# Patient Record
Sex: Female | Born: 1990 | Race: White | Hispanic: No | Marital: Single | Smoking: Never smoker
Health system: Southern US, Community
[De-identification: ages and names within clinical notes are randomized; demographics above are authoritative.]

## PROBLEM LIST (undated history)

## (undated) DIAGNOSIS — F419 Anxiety disorder, unspecified: Secondary | ICD-10-CM

## (undated) DIAGNOSIS — T7840XA Allergy, unspecified, initial encounter: Secondary | ICD-10-CM

## (undated) DIAGNOSIS — Z8709 Personal history of other diseases of the respiratory system: Secondary | ICD-10-CM

## (undated) DIAGNOSIS — F32A Depression, unspecified: Secondary | ICD-10-CM

## (undated) DIAGNOSIS — D649 Anemia, unspecified: Secondary | ICD-10-CM

## (undated) DIAGNOSIS — F329 Major depressive disorder, single episode, unspecified: Secondary | ICD-10-CM

## (undated) DIAGNOSIS — F410 Panic disorder [episodic paroxysmal anxiety] without agoraphobia: Secondary | ICD-10-CM

## (undated) HISTORY — PX: WISDOM TOOTH EXTRACTION: SHX21

## (undated) HISTORY — DX: Anxiety disorder, unspecified: F41.9

## (undated) HISTORY — DX: Depression, unspecified: F32.A

## (undated) HISTORY — DX: Allergy, unspecified, initial encounter: T78.40XA

## (undated) HISTORY — DX: Major depressive disorder, single episode, unspecified: F32.9

---
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
## 2015-10-16 ENCOUNTER — Other Ambulatory Visit (HOSPITAL_COMMUNITY): Payer: Self-pay | Admitting: General Surgery

## 2015-11-17 ENCOUNTER — Other Ambulatory Visit (HOSPITAL_COMMUNITY): Payer: Self-pay | Admitting: General Surgery

## 2015-11-24 ENCOUNTER — Ambulatory Visit: Payer: Self-pay | Admitting: Dietician

## 2015-11-27 ENCOUNTER — Encounter: Payer: Self-pay | Admitting: Family Medicine

## 2015-11-27 ENCOUNTER — Ambulatory Visit (HOSPITAL_COMMUNITY)
Admission: RE | Admit: 2015-11-27 | Discharge: 2015-11-27 | Disposition: A | Discharge status: Home / Self Care | Payer: BC Managed Care – PPO | Source: Ambulatory Visit | Attending: General Surgery | Admitting: General Surgery

## 2015-11-27 DIAGNOSIS — T7840XA Allergy, unspecified, initial encounter: Secondary | ICD-10-CM | POA: Insufficient documentation

## 2015-11-27 DIAGNOSIS — F329 Major depressive disorder, single episode, unspecified: Secondary | ICD-10-CM | POA: Insufficient documentation

## 2015-11-27 DIAGNOSIS — Z6841 Body Mass Index (BMI) 40.0 and over, adult: Secondary | ICD-10-CM | POA: Insufficient documentation

## 2015-11-27 DIAGNOSIS — F32A Depression, unspecified: Secondary | ICD-10-CM | POA: Insufficient documentation

## 2015-11-27 DIAGNOSIS — F419 Anxiety disorder, unspecified: Secondary | ICD-10-CM | POA: Insufficient documentation

## 2015-12-02 ENCOUNTER — Encounter: Payer: Self-pay | Admitting: Family Medicine

## 2015-12-02 ENCOUNTER — Ambulatory Visit (INDEPENDENT_AMBULATORY_CARE_PROVIDER_SITE_OTHER): Payer: BC Managed Care – PPO | Admitting: Family Medicine

## 2015-12-02 VITALS — BP 128/74 | HR 82 | Temp 98.4°F | Resp 14 | Ht 67.0 in | Wt 393.0 lb

## 2015-12-02 DIAGNOSIS — F329 Major depressive disorder, single episode, unspecified: Secondary | ICD-10-CM | POA: Diagnosis not present

## 2015-12-02 DIAGNOSIS — Z0000 Encounter for general adult medical examination without abnormal findings: Secondary | ICD-10-CM

## 2015-12-02 DIAGNOSIS — F32A Depression, unspecified: Secondary | ICD-10-CM

## 2015-12-02 NOTE — Assessment & Plan Note (Signed)
We'll continue to monitor her symptoms for now. She declines seeing a therapist at this time.

## 2015-12-02 NOTE — Progress Notes (Signed)
Patient ID: Alexandra Newton, female   DOB: 11-29-1990, 25 y.o.   MRN: [id number]   Subjective:    Patient ID: Alexandra Newton, female    DOB: 08-20-1990, 25 y.o.   MRN: [id number]  Patient presents for [facility] Patient here to establish care. [facility]. She has no significant past medical history with the exception of morbid obesity. She's been obese since the third grade. She's tried multiple diets and dietary supplements. She has been worked up for bariatric weight loss and should have the surgery within the next month. She is not on any regular medications. She did fall on March 30 at a restaurant and hit her tailbone and her back. She was seen by orthopedic urgent care x-rays were negative. She was given anti-inflammatory as well as hydrocodone which she sparingly uses. Her back is now improved but she still has some tenderness near her tailbone.  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  She has not a relationship she is not sexually active she has never had a Pap smear.  She did have a concern as she has a paternal aunt who had breast cancer at age 73 with bilateral mastectomy she said there was some type of genetic predisposing her but she does not have any first-degree relatives with breast cancer and she wants to know she needed an early mammogram.  She's history of anxiety and depression state that this has been surrounded about her weight. She has seen a psychiatrist with the bariatric program. They're not recommended that she be on any medication but did broach her having therapy especially after her surgery to help with her weight loss journey. She states that she was never prescribed any medications by a physician but did take some medications that her mother had some years ago which helped.  Review Of Systems:  GEN- denies fatigue, fever, weight loss,weakness, recent illness HEENT- denies eye drainage, change in vision, nasal discharge, CVS-  denies chest pain, palpitations RESP- denies SOB, cough, wheeze ABD- denies N/V, change in stools, abd pain GU- denies dysuria, hematuria, dribbling, incontinence MSK- denies joint pain, muscle aches, injury Neuro- denies headache, dizziness, syncope, seizure activity       Objective:    BP 128/74 mmHg  Pulse 82  Temp(Src) 98.4 F (36.9 C) (Oral)  Resp 14  Ht  (1.702 m)  Wt 393 lb (178.264 kg)  BMI 61.54 kg/m2  LMP 11/17/2015 (Approximate) GEN- NAD, alert and oriented x3,obese HEENT- PERRL, EOMI, non injected sclera, pink conjunctiva, MMM, oropharynx clear Neck- Supple, no thyromegaly CVS- RRR, no murmur RESP-CTAB ABD-NABS,soft,NT,ND MSK- Spine NT, fair ROM 2/2 habitus, neg SLR, no spasm  Psych- normal affect and mood  EXT- No edema Pulses- Radial, DP- 2+        Assessment & Plan:      Problem List Items Addressed This Visit    Morbid obesity (HCC)   Depression    We'll continue to monitor her symptoms for now. She declines seeing a therapist at this time.       Other Visit Diagnoses    Routine general medical examination at a health care facility    -  Primary    Physical done. She wants to defer Pap smear. This will be difficult with her habitus currently almost 400 pounds., Hopefully she will have bariatric sleeve done Soon. She has recently had fasting labs with the bariatric program which I will obtain. I do not  see any reason for her to have an early mammogram but I will check the recommendations again especially since this is not a first-degree relative.        Note: This dictation was prepared with Dragon dictation along with smaller phrase technology. Any transcriptional errors that result from this process are unintentional.

## 2015-12-02 NOTE — Patient Instructions (Addendum)
[facility] Needs last Note and Fasting labs We will call about Mammogram  F/U as needed

## 2015-12-22 ENCOUNTER — Encounter: Payer: BC Managed Care – PPO | Attending: General Surgery | Admitting: Dietician

## 2015-12-22 ENCOUNTER — Encounter: Payer: Self-pay | Admitting: Family Medicine

## 2015-12-22 DIAGNOSIS — Z01818 Encounter for other preprocedural examination: Secondary | ICD-10-CM | POA: Diagnosis present

## 2015-12-22 NOTE — Progress Notes (Signed)
  Pre-Op Assessment Visit:  Pre-Operative Sleeve Gastrectomy Surgery  Medical Nutrition Therapy:  Appt start time: 1100   End time:  1140.  Patient was seen on 12/22/2015 for Pre-Operative Nutrition Assessment. Assessment and letter of approval [facility] coordinator on 12/22/2015.   Preferred Learning Style:   No preference indicated   Learning Readiness:   Ready  Handouts given during visit include:  Pre-Op Goals Bariatric Surgery Protein Shakes Therapists in the area   During the appointment today the following Pre-Op Goals were reviewed with the patient: Maintain or lose weight as instructed by your surgeon Make healthy food choices Begin to limit portion sizes Limited concentrated sugars and fried foods Keep fat/sugar in the single digits per serving on   food labels Practice CHEWING your food  (aim for 30 chews per bite or until applesauce consistency) Practice not drinking 15 minutes before, during, and 30 minutes after each meal/snack Avoid all carbonated beverages  Avoid/limit caffeinated beverages  Avoid all sugar-sweetened beverages Consume 3 meals per day; eat every 3-5 hours Make a list of non-food related activities Aim for 64-100 ounces of FLUID daily  Aim for at least 60-80 grams of PROTEIN daily Look for a liquid protein source that contain ?15 g protein and ?5 g carbohydrate  (ex: shakes, drinks, shots)  Patient-Centered Goals: Goals: no longer having weight dictate every thing in her life  7-8 confidence/10 importance scale  Demonstrated degree of understanding via:  Teach Back  Teaching Method Utilized:  Visual Auditory Hands on  Barriers to learning/adherence to lifestyle change: none  Patient to call the [facility] to enroll in Pre-Op and Post-Op Nutrition Education when surgery date is scheduled.

## 2015-12-22 NOTE — Patient Instructions (Signed)
Follow Pre-Op Goals Try Protein Shakes Call [facility] at [phone] when surgery is scheduled to enroll in Pre-Op Class  Things to remember:  Please always be honest with us. We want to support you!  If you have any questions or concerns in between appointments, please call or email Liz, Leslie, or Laurie.  The diet after surgery will be high protein and low in carbohydrate.  Vitamins and calcium need to be taken for the rest of your life.  Feel free to include support people in any classes or appointments.   Supplement recommendations:  Complete" Multivitamin: Sleeve Gastrectomy and RYGB patients take a double dose of MVI. LAGB patients take single dose as it is written on the package. Vitamin must be liquid or chewable but not gummy. Examples of these include Flintstones Complete and Centrum Complete. If the vitamin is bariatric-specific, take 1 dose as it is already formulated for bariatric surgery patients. Examples of these are Bariatric Advantage, Celebrate, and Wellesse. These can be found at the [facility] and/or online.     Calcium citrate: 1500 mg/day of Calcium citrate (also chewable or liquid) is recommended for all procedures. The body is only able to absorb 500-600 mg of Calcium at one time so 3 daily doses of 500 mg are recommended. Calcium doses must be taken a minimum of 2 hours apart. Additionally, Calcium must be taken 2 hours apart from iron-containing MVI. Examples of brands include Celebrate, Bariatric Advantage, and Wellesse. These brands must be purchased online or at the [facility]. Citracal Petites is the only Calcium citrate supplement found in general grocery stores and pharmacies. This is in tablet form and may be recommended for patients who do not tolerate chewable Calcium.  Continued or added Vitamin D supplementation based on individual needs.    Vitamin B12: 300-500 mcg/day for Sleeve Gastrectomy and RYGB. Optional for  LAGB patients as stomach remains fully intact. Must be taken intramuscularly, sublingually, or inhaled nasally. Oral route is not recommended. 

## 2016-01-13 ENCOUNTER — Encounter: Payer: Self-pay | Admitting: Family Medicine

## 2016-01-26 ENCOUNTER — Other Ambulatory Visit: Payer: Self-pay | Admitting: General Surgery

## 2016-02-08 ENCOUNTER — Encounter: Payer: BC Managed Care – PPO | Attending: General Surgery

## 2016-02-08 DIAGNOSIS — Z01818 Encounter for other preprocedural examination: Secondary | ICD-10-CM | POA: Diagnosis present

## 2016-02-08 NOTE — Progress Notes (Signed)
  Pre-Operative Nutrition Class:  Appt start time: 6789   End time:  1830.  Patient was seen on 02/08/2016 for Pre-Operative Bariatric Surgery Education at the [facility].   Surgery date: 02/22/2016 Surgery type: sleeve gastrectomy [facility] weight at [facility]: 395 lbs on 12/22/2015 Weight today: 395.4 lbs  TANITA  BODY COMP RESULTS  02/08/16   BMI (kg/m^2) 61.9   Fat Mass (lbs) 236   Fat Free Mass (lbs) 159.4   Total Body Water (lbs) N/A   Samples given per MNT protocol. Patient educated on appropriate usage: Bariatric Advantage Multivitamin chewable (mixed fruit - qty 1) Lot #: [id number] Exp: 02/2017  Bariatric Advantage Calcium Citrate chew (orange - qty 1) Lot #: [id number] Exp: 03/2016  Premier protein shake (strawberry - qty 1) Lot #: [id number] Exp: 12/2016  Renee Pain Protein Powder (chocolate - qty 1) Lot #: [id number] Exp: 06/2017  The following the learning objectives were met by the patient during this course:  Identify Pre-Op Dietary Goals and will begin 2 weeks pre-operatively  Identify appropriate sources of fluids and proteins   State protein recommendations and appropriate sources pre and post-operatively  Identify Post-Operative Dietary Goals and will follow for 2 weeks post-operatively  Identify appropriate multivitamin and calcium sources  Describe the need for physical activity post-operatively and will follow MD recommendations  State when to call healthcare provider regarding medication questions or post-operative complications  Handouts given during class include:  Pre-Op Bariatric Surgery Diet Handout  Protein Shake Handout  Post-Op Bariatric Surgery Nutrition Handout  BELT Program Information Flyer  Support Group Information Flyer  [facility] Bariatric Supplements Price List  Follow-Up Plan: Patient will follow-up at [facility] 2 weeks post operatively for diet advancement per MD.

## 2016-02-19 ENCOUNTER — Encounter (HOSPITAL_COMMUNITY)
Admission: RE | Admit: 2016-02-19 | Discharge: 2016-02-19 | Disposition: A | Discharge status: Home / Self Care | Payer: BC Managed Care – PPO | Source: Ambulatory Visit | Attending: General Surgery | Admitting: General Surgery

## 2016-02-19 ENCOUNTER — Encounter (HOSPITAL_COMMUNITY): Payer: Self-pay

## 2016-02-19 DIAGNOSIS — K2950 Unspecified chronic gastritis without bleeding: Secondary | ICD-10-CM | POA: Diagnosis not present

## 2016-02-19 DIAGNOSIS — Z6841 Body Mass Index (BMI) 40.0 and over, adult: Secondary | ICD-10-CM | POA: Diagnosis not present

## 2016-02-19 DIAGNOSIS — D649 Anemia, unspecified: Secondary | ICD-10-CM | POA: Diagnosis not present

## 2016-02-19 HISTORY — DX: Personal history of other diseases of the respiratory system: Z87.09

## 2016-02-19 HISTORY — DX: Panic disorder (episodic paroxysmal anxiety): F41.0

## 2016-02-19 HISTORY — DX: Anemia, unspecified: D64.9

## 2016-02-19 LAB — CBC WITH DIFFERENTIAL/PLATELET
Basophils Absolute: 0 10*3/uL (ref 0.0–0.1)
Basophils Relative: 0 %
EOS PCT: 1 %
Eosinophils Absolute: 0.1 10*3/uL (ref 0.0–0.7)
HCT: 34 % — ABNORMAL LOW (ref 36.0–46.0)
Hemoglobin: 11.1 g/dL — ABNORMAL LOW (ref 12.0–15.0)
LYMPHS ABS: 2.3 10*3/uL (ref 0.7–4.0)
LYMPHS PCT: 21 %
MCH: 27.4 pg (ref 26.0–34.0)
MCHC: 32.6 g/dL (ref 30.0–36.0)
MCV: 84 fL (ref 78.0–100.0)
MONO ABS: 0.5 10*3/uL (ref 0.1–1.0)
MONOS PCT: 4 %
Neutro Abs: 7.7 10*3/uL (ref 1.7–7.7)
Neutrophils Relative %: 74 %
PLATELETS: 335 10*3/uL (ref 150–400)
RBC: 4.05 MIL/uL (ref 3.87–5.11)
RDW: 14.8 % (ref 11.5–15.5)
WBC: 10.6 10*3/uL — ABNORMAL HIGH (ref 4.0–10.5)

## 2016-02-19 LAB — COMPREHENSIVE METABOLIC PANEL
ALK PHOS: 62 U/L (ref 38–126)
ALT: 41 U/L (ref 14–54)
ANION GAP: 7 (ref 5–15)
AST: 31 U/L (ref 15–41)
Albumin: 4.1 g/dL (ref 3.5–5.0)
BUN: 11 mg/dL (ref 6–20)
CHLORIDE: 107 mmol/L (ref 101–111)
CO2: 24 mmol/L (ref 22–32)
Calcium: 9 mg/dL (ref 8.9–10.3)
Creatinine, Ser: 0.51 mg/dL (ref 0.44–1.00)
GFR calc Af Amer: >60 mL/min (ref >60)
GLUCOSE: 110 mg/dL — AB (ref 65–99)
POTASSIUM: 3.9 mmol/L (ref 3.5–5.1)
SODIUM: 138 mmol/L (ref 135–145)
TOTAL PROTEIN: 7.5 g/dL (ref 6.5–8.1)
Total Bilirubin: 0.6 mg/dL (ref 0.3–1.2)

## 2016-02-19 NOTE — Progress Notes (Signed)
Spoke with Amy in portable equipment in regards to order for Bari Bed for Monday 02/22/2016. Order placed.

## 2016-02-19 NOTE — Patient Instructions (Signed)
Gisel Flemister  02/19/2016   Your procedure is scheduled on: Monday February 22, 2016  [facility]  Entrance take [place]  elevators to 3rd floor to  Short Stay Center at 8:00 AM.  Call this number if you have problems the morning of surgery [phone]   Remember: ONLY 1 PERSON MAY GO WITH YOU TO SHORT STAY TO GET  READY MORNING OF YOUR SURGERY.  Do not eat food or drink liquids :After Midnight.     Take these medicines the morning of surgery with A SIP OF WATER: NONE                               You may not have any metal on your body including hair pins and              piercings  Do not wear jewelry, make-up, lotions, powders or perfumes, deodorant             Do not wear nail polish.  Do not shave  48 hours prior to surgery.                Do not bring valuables to the hospital. [facility] IS NOT             RESPONSIBLE   FOR VALUABLES.  Contacts, dentures or bridgework may not be worn into surgery.  Leave suitcase in the car. After surgery it may be brought to your room.   _____________________________________________________________________             [facility] - Preparing for Surgery Before surgery, you can play an important role.  Because skin is not sterile, your skin needs to be as free of germs as possible.  You can reduce the number of germs on your skin by washing with CHG (chlorahexidine gluconate) soap before surgery.  CHG is an antiseptic cleaner which kills germs and bonds with the skin to continue killing germs even after washing. Please DO NOT use if you have an allergy to CHG or antibacterial soaps.  If your skin becomes reddened/irritated stop using the CHG and inform your nurse when you arrive at Short Stay. Do not shave (including legs and underarms) for at least 48 hours prior to the first CHG shower.  You may shave your face/neck. Please follow these instructions carefully:  1.  Shower with CHG Soap the night before surgery  and the  morning of Surgery.  2.  If you choose to wash your hair, wash your hair first as usual with your  normal  shampoo.  3.  After you shampoo, rinse your hair and body thoroughly to remove the  shampoo.                           4.  Use CHG as you would any other liquid soap.  You can apply chg directly  to the skin and wash                       Gently with a scrungie or clean washcloth.  5.  Apply the CHG Soap to your body ONLY FROM THE NECK DOWN.   Do not use on face/ open  Wound or open sores. Avoid contact with eyes, ears mouth and genitals (private parts).                       Wash face,  Genitals (private parts) with your normal soap.             6.  Wash thoroughly, paying special attention to the area where your surgery  will be performed.  7.  Thoroughly rinse your body with warm water from the neck down.  8.  DO NOT shower/wash with your normal soap after using and rinsing off  the CHG Soap.                9.  Pat yourself dry with a clean towel.            10.  Wear clean pajamas.            11.  Place clean sheets on your bed the night of your first shower and do not  sleep with pets. Day of Surgery : Do not apply any lotions/deodorants the morning of surgery.  Please wear clean clothes to the hospital/surgery center.  FAILURE TO FOLLOW THESE INSTRUCTIONS MAY RESULT IN THE CANCELLATION OF YOUR SURGERY PATIENT SIGNATURE_________________________________  NURSE SIGNATURE__________________________________  ________________________________________________________________________

## 2016-02-21 NOTE — H&P (Signed)
History of Present Illness Alexandra Newton Macqueen MD; 02/18/2016 11:43 AM) Patient words: pre op.  The patient is a 24 year old female who presents with obesity. She was referred by Alexandra Rushing FNP for consideration for surgical treatment for morbid obesity. The patient gives a history of progressive obesity since childhood despite multiple attempts at medical management. 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 She has not developed any significant comorbidities although of course she is very young. She is concerned about her long-term weight going forward. Obesity runs in her family and her mother has had successful gastric bypass surgery. Diabetes is prevalent in her family.. After our initial consultation she elected to proceed with laparoscopic sleeve gastrectomy. She has completed her preoperative evaluation. No concerns on psychological nutrition evaluation. Upper GI series was normal without reflux or hiatal hernia. Chest x-ray normal. Lab work was unremarkable. She is on her preoperative diet. No significant intercurrent illness.   Problem List/Past Medical Alexandra Saa, MD; 02/18/2016 11:43 AM) MORBID OBESITY WITH BMI OF 50.0-59.9, ADULT (E66.01) ANXIETY AND DEPRESSION (F41.9, F32.9)  Other Problems Alexandra Saa, MD; 02/18/2016 11:43 AM) Depression Anxiety Disorder  Past Surgical History Alexandra Saa, MD; 02/18/2016 11:43 AM) Oral Surgery  Diagnostic Studies History Alexandra Saa, MD; 02/18/2016 11:43 AM) Pap Smear never Colonoscopy never Mammogram never  Allergies Alexandra Newton, CMA; 02/18/2016 11:25 AM) No Known Drug Allergies11/05/2015  Medication History Alexandra Saa, MD; 02/18/2016 11:43 AM) Tylenol (  Tablet, Oral as needed) Active. No Current Medications (Taken starting 02/18/2016) OxyCODONE HCl ( /5ML  Solution, 5-10 Milliliter Oral every four hours, as needed, Taken starting 02/18/2016) Active. Protonix (  Tablet DR, 1 (one) Tablet Oral daily, Taken starting 02/18/2016) Active. Zofran ODT (  Tablet Disperse, 1 (one) Tablet Oral every six hours, as needed, Taken starting 02/18/2016) Active.  Social History Alexandra Saa, MD; 02/18/2016 11:43 AM) Caffeine use Carbonated beverages, Tea. No alcohol use No drug use Tobacco use Never smoker.  Family History Alexandra Saa, MD; 02/18/2016 11:43 AM) Prostate Cancer Family Members In General. Respiratory Condition Family Members In General. Ovarian Cancer Family Members In General, Mother. Alcohol Abuse Family Members In General, Father. Arthritis Family Members In General, Father, Mother. Hypertension Family Members In General, Father, Mother. Migraine Headache Family Members In General. Depression Family Members In General, Father, Mother. Bleeding disorder Family Members In General. Breast Cancer Family Members In General.  Pregnancy / Birth History Alexandra Saa, MD; 02/18/2016 11:43 AM) Irregular periods Age at menarche 13 years.  Vitals Alexandra Newton CMA; 02/18/2016 11:25 AM) 02/18/2016 11:25 AM Weight: 390.4 lb Height: 67in Body Surface Area: 2.69 m Body Mass Index: 61.14 kg/m  Temp.: 97.75F  Pulse: 87 (Regular)  BP: 128/64 (Sitting, Left Arm, Standard)       Physical Exam Alexandra Salina T. Tamarion Haymond MD; 02/18/2016 11:44 AM) The physical exam findings are as follows: Note:General: Alert, morbidly obese Caucasian female, in no distress Skin: Warm and dry without rash or infection. HEENT: No palpable masses or thyromegaly. Sclera nonicteric. Pupils equal round and reactive. Oropharynx clear. Lymph nodes: No cervical, supraclavicular, or inguinal nodes palpable. Lungs: Breath sounds clear and equal. No wheezing or increased work of breathing. Cardiovascular: Regular rate and rhythm  without murmer. No JVD or edema. Abdomen: Marked obesity Nondistended. Soft and nontender. No masses palpable. No organomegaly. No palpable hernias. Extremities: No edema or joint swelling or deformity. No  chronic venous stasis changes. Neurologic: Alert and fully oriented. Gait normal. No focal weakness. Psychiatric: Normal mood and affect. Thought content appropriate with normal judgement and insight    Assessment & Plan Alexandra Salina(Alexandra Simonet T. Latiana Tomei MD; 02/18/2016 11:45 AM) MORBID OBESITY WITH BMI OF 50.0-59.9, ADULT (E66.01) Impression: 25 year old female with severe morbid obesity presenting at a BMI of over 59. She is young enough that she has to date of void serious comorbidities but her weight is a daily problem for her and she is certainly at high risk for complications from her weight. She certainly would have significant potential benefit from surgical weight loss. Completed workup and ready to proceed with laparoscopic sleeve gastrectomy. She is given prescriptions for pain medication as well as nausea medication and Protonix. We reviewed the procedure and recovery and the consent form and all her questions were answered.

## 2016-02-22 ENCOUNTER — Inpatient Hospital Stay (HOSPITAL_COMMUNITY): Payer: BC Managed Care – PPO | Admitting: Certified Registered Nurse Anesthetist

## 2016-02-22 ENCOUNTER — Encounter (HOSPITAL_COMMUNITY)
Admission: RE | Disposition: A | Discharge status: Home / Self Care | Payer: Self-pay | Source: Ambulatory Visit | Attending: General Surgery

## 2016-02-22 ENCOUNTER — Encounter (HOSPITAL_COMMUNITY): Payer: Self-pay | Admitting: *Deleted

## 2016-02-22 ENCOUNTER — Observation Stay (HOSPITAL_COMMUNITY)
Admission: RE | Admit: 2016-02-22 | Discharge: 2016-02-23 | Disposition: A | Discharge status: Home / Self Care | Payer: BC Managed Care – PPO | Source: Ambulatory Visit | Attending: General Surgery | Admitting: General Surgery

## 2016-02-22 DIAGNOSIS — K2950 Unspecified chronic gastritis without bleeding: Secondary | ICD-10-CM | POA: Insufficient documentation

## 2016-02-22 DIAGNOSIS — D649 Anemia, unspecified: Secondary | ICD-10-CM | POA: Insufficient documentation

## 2016-02-22 DIAGNOSIS — Z6841 Body Mass Index (BMI) 40.0 and over, adult: Secondary | ICD-10-CM | POA: Insufficient documentation

## 2016-02-22 HISTORY — PX: LAPAROSCOPIC GASTRIC SLEEVE RESECTION: SHX5895

## 2016-02-22 LAB — HEMOGLOBIN AND HEMATOCRIT, BLOOD
HEMATOCRIT: 33.8 % — AB (ref 36.0–46.0)
HEMOGLOBIN: 10.9 g/dL — AB (ref 12.0–15.0)

## 2016-02-22 LAB — PREGNANCY, URINE: Preg Test, Ur: NEGATIVE

## 2016-02-22 SURGERY — GASTRECTOMY, SLEEVE, LAPAROSCOPIC
Anesthesia: General

## 2016-02-22 MED ORDER — DEXAMETHASONE SODIUM PHOSPHATE 10 MG/ML IJ SOLN
INTRAMUSCULAR | Status: AC
  Filled 2016-02-22: qty 1

## 2016-02-22 MED ORDER — STERILE WATER FOR IRRIGATION IR SOLN
Status: DC | PRN
  Administered 2016-02-22: 1000 mL

## 2016-02-22 MED ORDER — LIDOCAINE HCL (CARDIAC) 20 MG/ML IV SOLN
INTRAVENOUS | Status: AC
  Filled 2016-02-22: qty 5

## 2016-02-22 MED ORDER — SODIUM CHLORIDE 0.9 % IJ SOLN
INTRAMUSCULAR | Status: DC | PRN
  Administered 2016-02-22: 60 mL

## 2016-02-22 MED ORDER — FAMOTIDINE IN NACL 20-0.9 MG/50ML-% IV SOLN
20.0000 mg | Freq: Two times a day (BID) | INTRAVENOUS | Status: DC
  Administered 2016-02-22 – 2016-02-23 (×2): 20 mg via INTRAVENOUS
  Filled 2016-02-22 (×2): qty 50

## 2016-02-22 MED ORDER — FENTANYL CITRATE (PF) 100 MCG/2ML IJ SOLN
INTRAMUSCULAR | Status: AC
  Filled 2016-02-22: qty 2

## 2016-02-22 MED ORDER — BUPIVACAINE-EPINEPHRINE 0.25% -1:200000 IJ SOLN
INTRAMUSCULAR | Status: DC | PRN
  Administered 2016-02-22: 25 mL

## 2016-02-22 MED ORDER — SUGAMMADEX SODIUM 500 MG/5ML IV SOLN
INTRAVENOUS | Status: AC
  Filled 2016-02-22: qty 5

## 2016-02-22 MED ORDER — ENOXAPARIN SODIUM 30 MG/0.3ML ~~LOC~~ SOLN
30.0000 mg | Freq: Two times a day (BID) | SUBCUTANEOUS | Status: DC
  Administered 2016-02-22 – 2016-02-23 (×2): 30 mg via SUBCUTANEOUS
  Filled 2016-02-22 (×2): qty 0.3

## 2016-02-22 MED ORDER — DEXTROSE 5 % IV SOLN
2.0000 g | INTRAVENOUS | Status: AC
  Administered 2016-02-22: 2 g via INTRAVENOUS

## 2016-02-22 MED ORDER — SUCCINYLCHOLINE CHLORIDE 20 MG/ML IJ SOLN
INTRAMUSCULAR | Status: DC | PRN
Start: 2016-02-22 — End: 2016-02-22
  Administered 2016-02-22: 180 mg via INTRAVENOUS

## 2016-02-22 MED ORDER — PROMETHAZINE HCL 25 MG/ML IJ SOLN
12.5000 mg | INTRAMUSCULAR | Status: DC | PRN
  Administered 2016-02-22: 12.5 mg via INTRAVENOUS
  Filled 2016-02-22: qty 1

## 2016-02-22 MED ORDER — ROCURONIUM BROMIDE 100 MG/10ML IV SOLN
INTRAVENOUS | Status: AC
  Filled 2016-02-22: qty 1

## 2016-02-22 MED ORDER — ONDANSETRON HCL 4 MG/2ML IJ SOLN
INTRAMUSCULAR | Status: AC
  Filled 2016-02-22: qty 2

## 2016-02-22 MED ORDER — FENTANYL CITRATE (PF) 250 MCG/5ML IJ SOLN
INTRAMUSCULAR | Status: AC
  Filled 2016-02-22: qty 5

## 2016-02-22 MED ORDER — PROMETHAZINE HCL 25 MG/ML IJ SOLN
6.2500 mg | INTRAMUSCULAR | Status: AC | PRN
  Administered 2016-02-22: 12.5 mg via INTRAVENOUS
  Administered 2016-02-22: 6.25 mg via INTRAVENOUS

## 2016-02-22 MED ORDER — MIDAZOLAM HCL 2 MG/2ML IJ SOLN
INTRAMUSCULAR | Status: AC
  Filled 2016-02-22: qty 2

## 2016-02-22 MED ORDER — LACTATED RINGERS IV SOLN
INTRAVENOUS | Status: DC
Start: 2016-02-22 — End: 2016-02-22
  Administered 2016-02-22: 11:00:00 via INTRAVENOUS
  Administered 2016-02-22: 1000 mL via INTRAVENOUS

## 2016-02-22 MED ORDER — SUGAMMADEX SODIUM 200 MG/2ML IV SOLN
INTRAVENOUS | Status: DC | PRN
  Administered 2016-02-22: 400 mg via INTRAVENOUS

## 2016-02-22 MED ORDER — LACTATED RINGERS IR SOLN
Status: DC | PRN
  Administered 2016-02-22: 1

## 2016-02-22 MED ORDER — LIDOCAINE HCL (CARDIAC) 20 MG/ML IV SOLN
INTRAVENOUS | Status: DC | PRN
  Administered 2016-02-22 (×2): 50 mg via INTRAVENOUS

## 2016-02-22 MED ORDER — PROPOFOL 10 MG/ML IV BOLUS
INTRAVENOUS | Status: AC
  Filled 2016-02-22: qty 20

## 2016-02-22 MED ORDER — KCL IN DEXTROSE-NACL 20-5-0.9 MEQ/L-%-% IV SOLN
INTRAVENOUS | Status: DC
  Administered 2016-02-22: 17:00:00 via INTRAVENOUS
  Administered 2016-02-23: 1000 mL via INTRAVENOUS
  Filled 2016-02-22 (×4): qty 1000

## 2016-02-22 MED ORDER — ACETAMINOPHEN 10 MG/ML IV SOLN
INTRAVENOUS | Status: AC
  Filled 2016-02-22: qty 100

## 2016-02-22 MED ORDER — METOCLOPRAMIDE HCL 5 MG/ML IJ SOLN
INTRAMUSCULAR | Status: AC
  Filled 2016-02-22: qty 2

## 2016-02-22 MED ORDER — DEXAMETHASONE SODIUM PHOSPHATE 4 MG/ML IJ SOLN
INTRAMUSCULAR | Status: DC | PRN
Start: 2016-02-22 — End: 2016-02-22
  Administered 2016-02-22: 10 mg via INTRAVENOUS

## 2016-02-22 MED ORDER — FENTANYL CITRATE (PF) 100 MCG/2ML IJ SOLN
25.0000 ug | INTRAMUSCULAR | Status: DC | PRN
  Administered 2016-02-22 (×2): 50 ug via INTRAVENOUS

## 2016-02-22 MED ORDER — METOCLOPRAMIDE HCL 5 MG/ML IJ SOLN
INTRAMUSCULAR | Status: DC | PRN
  Administered 2016-02-22: 10 mg via INTRAVENOUS

## 2016-02-22 MED ORDER — FENTANYL CITRATE (PF) 100 MCG/2ML IJ SOLN
INTRAMUSCULAR | Status: DC | PRN
  Administered 2016-02-22: 25 ug via INTRAVENOUS
  Administered 2016-02-22 (×2): 50 ug via INTRAVENOUS
  Administered 2016-02-22: 25 ug via INTRAVENOUS
  Administered 2016-02-22 (×2): 100 ug via INTRAVENOUS

## 2016-02-22 MED ORDER — PREMIER PROTEIN SHAKE: 2.0000 [oz_av] | ORAL | Status: DC

## 2016-02-22 MED ORDER — BUPIVACAINE LIPOSOME 1.3 % IJ SUSP
20.0000 mL | Freq: Once | INTRAMUSCULAR | Status: AC
  Administered 2016-02-22: 20 mL
  Filled 2016-02-22: qty 20

## 2016-02-22 MED ORDER — CHLORHEXIDINE GLUCONATE CLOTH 2 % EX PADS: 6.0000 | MEDICATED_PAD | Freq: Once | CUTANEOUS | Status: DC

## 2016-02-22 MED ORDER — SODIUM CHLORIDE 0.9 % IJ SOLN
INTRAMUSCULAR | Status: AC
  Filled 2016-02-22: qty 50

## 2016-02-22 MED ORDER — BUPIVACAINE-EPINEPHRINE 0.25% -1:200000 IJ SOLN
INTRAMUSCULAR | Status: AC
  Filled 2016-02-22: qty 1

## 2016-02-22 MED ORDER — ACETAMINOPHEN 160 MG/5ML PO SOLN
650.0000 mg | ORAL | Status: DC | PRN
  Administered 2016-02-23: 650 mg via ORAL
  Filled 2016-02-22: qty 20.3

## 2016-02-22 MED ORDER — OXYCODONE HCL 5 MG/5ML PO SOLN
5.0000 mg | ORAL | Status: DC | PRN
  Administered 2016-02-23: 5 mg via ORAL
  Filled 2016-02-22: qty 5

## 2016-02-22 MED ORDER — KETAMINE HCL 10 MG/ML IJ SOLN
INTRAMUSCULAR | Status: DC | PRN
  Administered 2016-02-22: 20 mg via INTRAVENOUS

## 2016-02-22 MED ORDER — PROPOFOL 10 MG/ML IV BOLUS
INTRAVENOUS | Status: DC | PRN
  Administered 2016-02-22: 200 mg via INTRAVENOUS

## 2016-02-22 MED ORDER — SODIUM CHLORIDE 0.9 % IJ SOLN
INTRAMUSCULAR | Status: AC
  Filled 2016-02-22: qty 10

## 2016-02-22 MED ORDER — HEPARIN SODIUM (PORCINE) 5000 UNIT/ML IJ SOLN
5000.0000 [IU] | INTRAMUSCULAR | Status: AC
  Administered 2016-02-22: 5000 [IU] via SUBCUTANEOUS
  Filled 2016-02-22: qty 1

## 2016-02-22 MED ORDER — PROMETHAZINE HCL 25 MG/ML IJ SOLN
INTRAMUSCULAR | Status: AC
  Filled 2016-02-22: qty 1

## 2016-02-22 MED ORDER — EVICEL 5 ML EX KIT
PACK | Freq: Once | CUTANEOUS | Status: AC
  Administered 2016-02-22: 1
  Filled 2016-02-22: qty 1

## 2016-02-22 MED ORDER — ONDANSETRON HCL 4 MG/2ML IJ SOLN
INTRAMUSCULAR | Status: DC | PRN
  Administered 2016-02-22: 4 mg via INTRAVENOUS

## 2016-02-22 MED ORDER — MORPHINE SULFATE (PF) 2 MG/ML IV SOLN
2.0000 mg | INTRAVENOUS | Status: DC | PRN
  Administered 2016-02-22 – 2016-02-23 (×3): 2 mg via INTRAVENOUS
  Filled 2016-02-22 (×3): qty 1

## 2016-02-22 MED ORDER — ROCURONIUM BROMIDE 100 MG/10ML IV SOLN
INTRAVENOUS | Status: DC | PRN
  Administered 2016-02-22: 20 mg via INTRAVENOUS
  Administered 2016-02-22 (×3): 10 mg via INTRAVENOUS
  Administered 2016-02-22: 30 mg via INTRAVENOUS

## 2016-02-22 MED ORDER — ACETAMINOPHEN 10 MG/ML IV SOLN
1000.0000 mg | Freq: Four times a day (QID) | INTRAVENOUS | Status: AC
  Administered 2016-02-22 – 2016-02-23 (×4): 1000 mg via INTRAVENOUS
  Filled 2016-02-22 (×3): qty 100

## 2016-02-22 MED ORDER — DEXTROSE 5 % IV SOLN
INTRAVENOUS | Status: AC
  Filled 2016-02-22: qty 2

## 2016-02-22 MED ORDER — EPHEDRINE SULFATE 50 MG/ML IJ SOLN
INTRAMUSCULAR | Status: AC
  Filled 2016-02-22: qty 1

## 2016-02-22 MED ORDER — ACETAMINOPHEN 160 MG/5ML PO SOLN: 325.0000 mg | ORAL | Status: DC | PRN

## 2016-02-22 MED ORDER — ONDANSETRON HCL 4 MG/2ML IJ SOLN
4.0000 mg | INTRAMUSCULAR | Status: DC | PRN
  Administered 2016-02-22: 4 mg via INTRAVENOUS
  Filled 2016-02-22: qty 2

## 2016-02-22 MED ORDER — 0.9 % SODIUM CHLORIDE (POUR BTL) OPTIME
TOPICAL | Status: DC | PRN
  Administered 2016-02-22: 1000 mL

## 2016-02-22 MED ORDER — EPHEDRINE SULFATE 50 MG/ML IJ SOLN
INTRAMUSCULAR | Status: DC | PRN
  Administered 2016-02-22: 15 mg via INTRAVENOUS
  Administered 2016-02-22: 10 mg via INTRAVENOUS

## 2016-02-22 MED ORDER — MIDAZOLAM HCL 5 MG/5ML IJ SOLN
INTRAMUSCULAR | Status: DC | PRN
  Administered 2016-02-22 (×2): 1 mg via INTRAVENOUS

## 2016-02-22 MED ORDER — GLYCOPYRROLATE 0.2 MG/ML IJ SOLN
INTRAMUSCULAR | Status: DC | PRN
  Administered 2016-02-22: 0.2 mg via INTRAVENOUS

## 2016-02-22 SURGICAL SUPPLY — 61 items
APPLICATOR COTTON TIP 6IN STRL (MISCELLANEOUS) IMPLANT
APPLIER CLIP ROT 10 11.4 M/L (STAPLE)
APPLIER CLIP ROT 13.4 12 LRG (CLIP) ×3
APR CLP MED LRG 11.4X10 (STAPLE)
BLADE SURG SZ11 CARB STEEL (BLADE) ×3 IMPLANT
CABLE HIGH FREQUENCY MONO STRZ (ELECTRODE) ×3 IMPLANT
CHLORAPREP W/TINT 26ML (MISCELLANEOUS) ×6 IMPLANT
CLIP APPLIE ROT 10 11.4 M/L (STAPLE) IMPLANT
CLIP APPLIE ROT 13.4 12 LRG (CLIP) ×1 IMPLANT
COVER SURGICAL LIGHT HANDLE (MISCELLANEOUS) IMPLANT
DEVICE PMI PUNCTURE CLOSURE (MISCELLANEOUS) ×3 IMPLANT
DEVICE SUT QUICK LOAD TK 5 (STAPLE) IMPLANT
DEVICE SUT TI-KNOT TK 5X26 (MISCELLANEOUS) IMPLANT
DEVICE SUTURE ENDOST 10MM (ENDOMECHANICALS) IMPLANT
DEVICE TI KNOT TK5 (MISCELLANEOUS)
DRAPE UTILITY XL STRL (DRAPES) ×6 IMPLANT
ELECT REM PT RETURN 9FT ADLT (ELECTROSURGICAL) ×3
ELECTRODE REM PT RTRN 9FT ADLT (ELECTROSURGICAL) ×1 IMPLANT
GAUZE SPONGE 4X4 12PLY STRL (GAUZE/BANDAGES/DRESSINGS) IMPLANT
GLOVE BIOGEL PI IND STRL 7.5 (GLOVE) ×1 IMPLANT
GLOVE BIOGEL PI INDICATOR 7.5 (GLOVE) ×2
GLOVE ECLIPSE 7.5 STRL STRAW (GLOVE) ×3 IMPLANT
GOWN STRL REUS W/TWL XL LVL3 (GOWN DISPOSABLE) ×12 IMPLANT
HOVERMATT SINGLE USE (MISCELLANEOUS) ×3 IMPLANT
KIT BASIN OR (CUSTOM PROCEDURE TRAY) ×3 IMPLANT
LIQUID BAND (GAUZE/BANDAGES/DRESSINGS) ×3 IMPLANT
MARKER SKIN DUAL TIP RULER LAB (MISCELLANEOUS) ×3 IMPLANT
NEEDLE SPNL 22GX3.5 QUINCKE BK (NEEDLE) ×3 IMPLANT
PACK CARDIOVASCULAR III (CUSTOM PROCEDURE TRAY) ×3 IMPLANT
PACK UNIVERSAL I (CUSTOM PROCEDURE TRAY) ×3 IMPLANT
POUCH SPECIMEN RETRIEVAL 10MM (ENDOMECHANICALS) IMPLANT
QUICK LOAD TK 5 (STAPLE)
RELOAD STAPLER BLUE 60MM (STAPLE) ×2 IMPLANT
RELOAD STAPLER GOLD 60MM (STAPLE) ×1 IMPLANT
RELOAD STAPLER GREEN 60MM (STAPLE) ×2 IMPLANT
SCISSORS LAP 5X45 EPIX DISP (ENDOMECHANICALS) ×3 IMPLANT
SET IRRIG TUBING LAPAROSCOPIC (IRRIGATION / IRRIGATOR) ×3 IMPLANT
SHEARS HARMONIC ACE PLUS 45CM (MISCELLANEOUS) ×3 IMPLANT
SLEEVE ADV FIXATION 5X100MM (TROCAR) ×3 IMPLANT
SLEEVE GASTRECTOMY 36FR VISIGI (MISCELLANEOUS) ×3 IMPLANT
SOLUTION ANTI FOG 6CC (MISCELLANEOUS) ×3 IMPLANT
SPONGE LAP 18X18 X RAY DECT (DISPOSABLE) ×3 IMPLANT
STAPLER ECHELON LONG 60 440 (INSTRUMENTS) ×3 IMPLANT
STAPLER RELOAD BLUE 60MM (STAPLE) ×6
STAPLER RELOAD GOLD 60MM (STAPLE) ×3
STAPLER RELOAD GREEN 60MM (STAPLE) ×6
SUT DEVICE BRAIDED 0X39 (SUTURE) IMPLANT
SUT MNCRL AB 4-0 PS2 18 (SUTURE) ×3 IMPLANT
SUT VICRYL 0 TIES 12 18 (SUTURE) ×3 IMPLANT
SYR 10ML ECCENTRIC (SYRINGE) ×3 IMPLANT
SYR 20CC LL (SYRINGE) ×3 IMPLANT
TIP RIGID 35CM EVICEL (HEMOSTASIS) ×3 IMPLANT
TOWEL OR 17X26 10 PK STRL BLUE (TOWEL DISPOSABLE) ×3 IMPLANT
TOWEL OR NON WOVEN STRL DISP B (DISPOSABLE) ×3 IMPLANT
TROCAR ADV FIXATION 5X100MM (TROCAR) ×3 IMPLANT
TROCAR BLADELESS 15MM (ENDOMECHANICALS) ×3 IMPLANT
TROCAR BLADELESS OPT 5 100 (ENDOMECHANICALS) ×3 IMPLANT
TUBING CONNECTING 10 (TUBING) ×2 IMPLANT
TUBING CONNECTING 10' (TUBING) ×1
TUBING ENDO SMARTCAP PENTAX (MISCELLANEOUS) ×3 IMPLANT
TUBING INSUF HEATED (TUBING) ×3 IMPLANT

## 2016-02-22 NOTE — Op Note (Signed)
Preoperative Diagnosis: MORBID OBESITY  Postoprative Diagnosis: MORBID OBESITY  Procedure: Procedure(s): LAPAROSCOPIC GASTRIC SLEEVE RESECTION WITH UPPER ENDO   Surgeon: Glenna FellowsHoxworth, Siearra Amberg T   Assistants: Ovidio Kinavid Newman  Anesthesia:  General endotracheal anesthesia  Indications: Patient is a 25 year old female who presents with a lifelong progressive marked morbid obesity and presents at a BMI of 61.5.  After extensive preoperative workup and discussion regarding alternatives and risks and nature of the procedure  We have elected to proceed with laparoscopic sleeve gastrectomy for surgical treatment of her morbid obesity.    Procedure Detail:  Patient was brought to the operating room, placed in the supine position on the operating table, and general endotracheal anesthesia induced. She received preoperative IV antibiotics. PAS were in place. She was given subcutaneous heparin. The abdomen was widely sterilely prepped and draped.  Patient timeout was performed and correct procedure verified.  Access was obtained with a 5 mm Optiview trocar in the left upper quadrant subcostal space without difficulty and pneumoperitoneum established. There was no evidence of trocar injury. Under direct vision a 5 mm trocar was placed laterally in the right upper quadrant, a 15 mm trocar in the right upper abdomen at the base of the falciform ligament and a 5 mm trocar above and to the left of the umbilicus for the camera port. Through a 5 mm subxiphoid site the [facility]  And the left lobe of the liver elevated with very good exposure of the stomach and hiatus. The patient was placed in steep reverse Trendelenburg. An additional 5 mm trocar was placed laterally in the left upper quadrant. Beginning at the mid greater curve vasculature was dissected just off the stomach and the lesser sac entered. The dissection progressed proximally along the greater curve with the Harmonic scalpel. Short gastric  vessels were sequentially divided. There was some minor adherence to the spleen that was taken down with the Harmonic scalpel without bleeding. The dissection was carried up toward the crus. The esophageal fat pad was mobilized. The left crus was fully dissected and the hiatus dissected. The  Hiatus appeared normal without hernia in her upper GI series had been normal. The fundus was completely mobilized. The dissection was then carried distally along the  Greater curve dividing the vasculature until we were free 5 cm from the pylorus carefully measured.  A few posterior attachments were then sharply divided until the stomach was completely freed down to its lesser curve vasculature.The VisiG 7436 French gastric tube was placed orally and advanced with its tip to the pylorus and then positioned along the lesser curve of the stomach oriented symmetrically and placed on continuous suction. The sleeve was begun with an initial firing of the 60 mm green load echelon stapler beginning 5 cm from the pylorus and angling toward but staying away from the incisura. A second firing of the green load 60 mm stapler was used to carry the staple line just past the incisura again allowing a little extra room here around the tube. One firing of the gold load stapler was used advancing proximally staying close to but not tight against the tube.The sleeve was then completed with 3 firings of the 60 mm blue load stapler with the final firing angling just lateral to the esophageal fat pad to complete the sleeve. The sleeve was then insufflated with the VisiG tube and under saline irrigation and it appeared symmetrical with no twisting or narrowing and there was no evidence of leak. The sleeve was desufflated  and the VisiG tube removed.  Dr. Ezzard Standing then performed upper endoscopy showing a nice symmetrical  Sleeve with no bleeding and no evidence of leak.  The staple line was thoroughly coated with Evicel. The gastrectomy specimen was  brought out through the 15 mm trocar site after dilating this. The trocar site was closed iinterrupted 0 Vicryl. The abdomen was inspected for hemostasis  Or trocar injury and no problems were seen. All CO2 was evacuated and trochars were removed after performing a TA P block and infiltrating the trocar sites with Exparel. Skin incisions were closed with subcuticular 5-0 Monocryl and Liquiban. Sponge needle and instrument counts were correct.    Findings: As above  Estimated Blood Loss:  Minimal         Drains: nnone  Blood Given: none          Specimens: sleeve gastrectomy specimen        Complications:  * No complications entered in OR log *         Disposition: PACU - hemodynamically stable.         Condition: stable

## 2016-02-22 NOTE — Transfer of Care (Signed)
Immediate Anesthesia Transfer of Care Note  Patient: Alexandra Newton  Procedure(s) Performed: Procedure(s): LAPAROSCOPIC GASTRIC SLEEVE RESECTION WITH UPPER ENDO (N/A)  Patient Location: PACU  Anesthesia Type:General  Level of Consciousness: Patient easily awoken, sedated, comfortable, cooperative, following commands, responds to stimulation.   Airway & Oxygen Therapy: Patient spontaneously breathing, ventilating well, oxygen via simple oxygen mask.  Post-op Assessment: Report given to PACU RN, vital signs reviewed and stable, moving all extremities.   Post vital signs: Reviewed and stable.  Complications: No apparent anesthesia complications

## 2016-02-22 NOTE — Anesthesia Procedure Notes (Signed)
Procedure Name: Intubation Date/Time: 02/22/2016 10:14 AM Performed by: Ludwig LeanJONES, Dayan Desa C Pre-anesthesia Checklist: Patient identified, Emergency Drugs available, Suction available and Patient being monitored Patient Re-evaluated:Patient Re-evaluated prior to inductionOxygen Delivery Method: Circle system utilized Preoxygenation: Pre-oxygenation with 100% oxygen Intubation Type: IV induction Ventilation: Mask ventilation without difficulty Laryngoscope Size: Glidescope and 3 Grade View: Grade I Tube type: Oral Tube size: 7.0 mm Number of attempts: 1 Airway Equipment and Method: Rigid stylet and Video-laryngoscopy Placement Confirmation: ETT inserted through vocal cords under direct vision,  positive ETCO2 and breath sounds checked- equal and bilateral Secured at: 21 cm Tube secured with: Tape Dental Injury: Teeth and Oropharynx as per pre-operative assessment  Difficulty Due To: Difficulty was anticipated

## 2016-02-22 NOTE — Anesthesia Postprocedure Evaluation (Signed)
Anesthesia Post Note  Patient: Alexandra Newton  Procedure(s) Performed: Procedure(s) (LRB): LAPAROSCOPIC GASTRIC SLEEVE RESECTION WITH UPPER ENDO (N/A)  Patient location during evaluation: PACU Anesthesia Type: General Level of consciousness: awake and alert Pain management: pain level controlled Vital Signs Assessment: post-procedure vital signs reviewed and stable Respiratory status: spontaneous breathing, nonlabored ventilation, respiratory function stable and patient connected to nasal cannula oxygen Cardiovascular status: blood pressure returned to baseline and stable Postop Assessment: no signs of nausea or vomiting Anesthetic complications: no    Last Vitals:  Filed Vitals:   02/22/16 1337 02/22/16 1355  BP:  121/71  Pulse: 87   Temp:  36.9 C  Resp: 22 16    Last Pain:  Filed Vitals:   02/22/16 1404  PainSc: 5                  Cecile HearingStephen Edward Turk

## 2016-02-22 NOTE — Op Note (Signed)
Name:  Dondrea Middendorf MRN: [id number] Date of Surgery: 02/22/2016  Preop Diagnosis:  Morbid Obesity  Postop Diagnosis:  Morbid Obesity (Weight - 3Ferdie Ping90, BMI - 61.1), S/P Gastric Sleeve  Procedure:  Upper endoscopy  (Intraoperative)  Surgeon:  Ovidio Kinavid Aahil Fredin, M.D.  Anesthesia:  GET  Indications for procedure: Alexandra Newton is a 25 y.o. female whose primary care physician is Milinda AntisURHAM, KAWANTA, MD and has completed a Gastric Sleeve today by Dr. Johna SheriffHoxworth.  I am doing an intraoperative upper endoscopy to evaluate the gastric pouch.  Operative Note: The patient is under general anesthesia.  Dr. Johna SheriffHoxworth is laparoscoping the patient while I do an upper endoscopy to evaluate the stomach pouch.  With the patient intubated, I passed the Pentax upper endoscope without difficulty down the esophagus.  The esophago-gastric junction was at 39 cm.    The mucosa of the stomach looked viable and the staple line was intact without bleeding.  I advanced to the pylorus, but did not go through it.  While I insufflated the stomach pouch with air, Dr. Johna SheriffHoxworth  flooded the upper abdomen with saline to put the gastric pouch under saline.  There was no bubbling or evidence of a leak.  There was no evidence of narrowing of the pouch and the gastric sleeve looked tubular.  The scope was then withdrawn.  The esophagus was unremarkable and the patient tolerated the endoscopy without difficulty.  Ovidio Kinavid Ariyon Gerstenberger, MD, [facility] Pager: [phone] Office phone:  [phone]

## 2016-02-22 NOTE — Interval H&P Note (Signed)
History and Physical Interval Note:  02/22/2016 9:41 AM  Alexandra Newton  has presented today for surgery, with the diagnosis of MORBID OBESITY  The various methods of treatment have been discussed with the patient and family. After consideration of risks, benefits and other options for treatment, the patient has consented to  Procedure(s): LAPAROSCOPIC GASTRIC SLEEVE RESECTION WITH UPPER ENDO (N/A) as a surgical intervention .  The patient's history has been reviewed, patient examined, no change in status, stable for surgery.  I have reviewed the patient's chart and labs.  Questions were answered to the patient's satisfaction.     Afreen Siebels T

## 2016-02-22 NOTE — Anesthesia Preprocedure Evaluation (Addendum)
Anesthesia Evaluation  Patient identified by MRN, date of birth, ID band Patient awake    Reviewed: Allergy & Precautions, NPO status , Patient's Chart, lab work & pertinent test results  Airway Mallampati: III  TM Distance: >3 FB Neck ROM: Full    Dental  (+) Teeth Intact, Dental Advisory Given   Pulmonary neg pulmonary ROS,    Pulmonary exam normal breath sounds clear to auscultation       Cardiovascular Exercise Tolerance: Good negative cardio ROS Normal cardiovascular exam Rhythm:Regular Rate:Normal     Neuro/Psych PSYCHIATRIC DISORDERS Anxiety Depression negative neurological ROS     GI/Hepatic negative GI ROS, Neg liver ROS,   Endo/Other  Morbid obesityBMI 60  Renal/GU negative Renal ROS     Musculoskeletal negative musculoskeletal ROS (+)   Abdominal   Peds  Hematology  (+) Blood dyscrasia, anemia ,   Anesthesia Other Findings Day of surgery medications reviewed with the patient.  Reproductive/Obstetrics negative OB ROS                             Anesthesia Physical Anesthesia Plan  ASA: IV  Anesthesia Plan: General   Post-op Pain Management:    Induction: Intravenous  Airway Management Planned: Oral ETT and Video Laryngoscope Planned  Additional Equipment:   Intra-op Plan:   Post-operative Plan: Extubation in OR  Informed Consent: I have reviewed the patients History and Physical, chart, labs and discussed the procedure including the risks, benefits and alternatives for the proposed anesthesia with the patient or authorized representative who has indicated his/her understanding and acceptance.   Dental advisory given  Plan Discussed with: CRNA  Anesthesia Plan Comments: (Risks/benefits of general anesthesia discussed with patient including risk of damage to teeth, lips, gum, and tongue, nausea/vomiting, allergic reactions to medications, and the possibility of  heart attack, stroke and death.  All patient questions answered.  Patient wishes to proceed.)       Anesthesia Quick Evaluation

## 2016-02-22 NOTE — Progress Notes (Signed)
Patient given zofran, however continues to have episodes of nausea and vomiting. MD paged and received order for phenergan 12.5mg  IV Q4PRN.

## 2016-02-23 LAB — CBC WITH DIFFERENTIAL/PLATELET
BASOS PCT: 0 %
Basophils Absolute: 0 10*3/uL (ref 0.0–0.1)
Eosinophils Absolute: 0 10*3/uL (ref 0.0–0.7)
Eosinophils Relative: 0 %
HEMATOCRIT: 32.7 % — AB (ref 36.0–46.0)
Hemoglobin: 10.3 g/dL — ABNORMAL LOW (ref 12.0–15.0)
LYMPHS ABS: 1.8 10*3/uL (ref 0.7–4.0)
Lymphocytes Relative: 15 %
MCH: 26.6 pg (ref 26.0–34.0)
MCHC: 31.5 g/dL (ref 30.0–36.0)
MCV: 84.5 fL (ref 78.0–100.0)
MONO ABS: 0.7 10*3/uL (ref 0.1–1.0)
MONOS PCT: 6 %
NEUTROS ABS: 9.9 10*3/uL — AB (ref 1.7–7.7)
Neutrophils Relative %: 79 %
Platelets: 313 10*3/uL (ref 150–400)
RBC: 3.87 MIL/uL (ref 3.87–5.11)
RDW: 14.7 % (ref 11.5–15.5)
WBC: 12.5 10*3/uL — ABNORMAL HIGH (ref 4.0–10.5)

## 2016-02-23 NOTE — Progress Notes (Signed)
D/c instructions/meds/followup/wound care reviewed with patient, patient verbalized understanding, no questions regarding her discharge and is aware of her follow up appointments as well as the bariatric class.

## 2016-02-23 NOTE — Plan of Care (Signed)
Problem: Food- and Nutrition-Related Knowledge Deficit (NB-1.1) Goal: Nutrition education Formal process to instruct or train a patient/client in a skill or to impart knowledge to help patients/clients voluntarily manage or modify food choices and eating behavior to maintain or improve health. Outcome: Completed/Met Date Met:  02/23/16 Nutrition Education Note  Received consult for diet education per DROP protocol.   Discussed 2 week post op diet with pt. Emphasized that liquids must be non carbonated, non caffeinated, and sugar free. Fluid goals discussed. Reviewed progression of diet to include soft proteins at 7-10 days post-op. Pt to follow up with outpatient bariatric RD for further diet progression after 2 weeks. Multivitamins and minerals also reviewed. Teach back method used, pt expressed understanding, expect good compliance.   Diet: First 2 Weeks  You will see the dietitian about two (2) weeks after your surgery. The dietitian will increase the types of foods you can eat if you are handling liquids well:  If you have severe vomiting or nausea and cannot handle clear liquids lasting longer than 1 day, call your surgeon  Protein Shake  Drink at least 2 ounces of shake 5-6 times per day  Each serving of protein shakes (usually 8 - 12 ounces) should have a minimum of:  15 grams of protein  And no more than 5 grams of carbohydrate  Goal for protein each day:  Men = 80 grams per day  Women = 60 grams per day  Protein powder may be added to fluids such as non-fat milk or Lactaid milk or Soy milk (limit to 35 grams added protein powder per serving)   Hydration  Slowly increase the amount of water and other clear liquids as tolerated (See Acceptable Fluids)  Slowly increase the amount of protein shake as tolerated  Sip fluids slowly and throughout the day  May use sugar substitutes in small amounts (no more than 6 - 8 packets per day; i.e. Splenda)   Fluid Goal  The first goal is to  drink at least 8 ounces of protein shake/drink per day (or as directed by the nutritionist); some examples of protein shakes are Syntrax Nectar, Adkins Advantage, EAS Edge HP, and Unjury. See handout from pre-op Bariatric Education Class:  Slowly increase the amount of protein shake you drink as tolerated  You may find it easier to slowly sip shakes throughout the day  It is important to get your proteins in first  Your fluid goal is to drink 64 - 100 ounces of fluid daily  It may take a few weeks to build up to this  32 oz (or more) should be clear liquids  And  32 oz (or more) should be full liquids (see below for examples)  Liquids should not contain sugar, caffeine, or carbonation   Clear Liquids:  Water or Sugar-free flavored water (i.e. Fruit H2O, Propel)  Decaffeinated coffee or tea (sugar-free)  Crystal Lite, Wyler's Lite, Minute Maid Lite  Sugar-free Jell-O  Bouillon or broth  Sugar-free Popsicle: *Less than 20 calories each; Limit 1 per day   Full Liquids:  Protein Shakes/Drinks + 2 choices per day of other full liquids  Full liquids must be:  No More Than 12 grams of Carbs per serving  No More Than 3 grams of Fat per serving  Strained low-fat cream soup  Non-Fat milk  Fat-free Lactaid Milk  Sugar-free yogurt (Dannon Lite & Fit, Greek yogurt)     Alexandra Pember, MS, RD, LDN Pager: [phone] After Hours Pager: [phone]        

## 2016-02-23 NOTE — Discharge Instructions (Addendum)

## 2016-02-23 NOTE — Progress Notes (Signed)
Patient alert and oriented, Post op day 1.  Provided support and encouragement.  Encouraged pulmonary toilet, ambulation and small sips of liquids.  All questions answered.  Will continue to monitor. 

## 2016-02-23 NOTE — Progress Notes (Signed)
Patient ID: Alexandra Newton, female   DOB: 02/19/1991, 25 y.o.   MRN: [id number] 1 Day Post-Op  Subjective: No complaints this morning. Had nausea and vomiting early postoperatively that has resolved.  Starting water and ice. Denies pain. He has been ambulatory in the hall.  Objective: Vital signs in last 24 hours: Temp:  [97.7 F (36.5 C)-98.5 F (36.9 C)] 98.2 F (36.8 C) (07/11 0550) Pulse Rate:  [56-97] 57 (07/11 0550) Resp:  [15-27] 18 (07/11 0550) BP: (102-147)/(57-93) 110/68 mmHg (07/11 0550) SpO2:  [95 %-100 %] 99 % (07/11 0550)    Intake/Output from previous day: 07/10 0701 - 07/11 0700 In: 3366.7 [I.V.:3366.7] Out: 25 [Blood:25] Intake/Output this shift:    General appearance: alert, cooperative and no distress Resp: no wheezing or increased work of breathing GI: normal findings: soft, non-tender Incision/Wound:clean and dry  Lab Results:   Recent Labs  02/22/16 1902 02/23/16 0329  WBC  --  12.5*  HGB 10.9* 10.3*  HCT 33.8* 32.7*  PLT  --  313   BMET No results for input(s): NA, K, CL, CO2, GLUCOSE, BUN, CREATININE, CALCIUM in the last 72 hours.   Studies/Results: No results found.  Anti-infectives: Anti-infectives    Start     Dose/Rate Route Frequency Ordered Stop   02/22/16 0752  cefOXitin (MEFOXIN) 2 g in dextrose 5 % 50 mL IVPB     2 g 100 mL/hr over 30 Minutes Intravenous On call to O.R. 02/22/16 0752 02/22/16 1019      Assessment/Plan: s/p Procedure(s): LAPAROSCOPIC GASTRIC SLEEVE RESECTION WITH UPPER ENDO Doing very well postoperatively.Marland Kitchen. She would appear to be a good candidate for discharge later today. Start protein shakes and discharge if tolerated.      Alexandra Newton T 02/23/2016

## 2016-02-23 NOTE — Discharge Summary (Signed)
   Patient ID: Alexandra Newton [id number] 25 y.o. 01/25/1991  02/22/2016  Discharge date and time: 02/23/2016   Admitting Physician: Glenna FellowsHOXWORTH,Shedric Fredericks T  Discharge Physician: Glenna FellowsHOXWORTH,Ionia Schey T  Admission Diagnoses: MORBID OBESITY  Discharge Diagnoses: ssame  Operations: Procedure(s): LAPAROSCOPIC GASTRIC SLEEVE RESECTION WITH UPPER ENDO  Admission Condition: good  Discharged Condition: good  Indication for Admission: patient has severe progressive morbid obesity unresponsive to medical management. Following an extensive preoperative workup and discussion detailed elsewhere she is brought in electively for sleeve gastrectomy  Hospital Course: ppatient underwent an uneventful laparoscopic sleeve gastrectomy. She had some nausea and vomiting on the afternoon of surgery that responded to medications. On the first postoperative day she is feeling much better. Denies nausea or pain. Abdomen is soft and nontender.Vital signs are stable. She is advanced to protein shakes and plans are for discharge and tolerated well.    Disposition: Home  Patient Instructions:    Medication List    STOP taking these medications        ibuprofen 200 MG tablet  Commonly known as:  ADVIL,MOTRIN      TAKE these medications        acetaminophen 325 MG tablet  Commonly known as:  TYLENOL  Take 650 mg by mouth every 6 (six) hours as needed for mild pain or headache.        Activity: activity as tolerated Diet: ariatric protein shakes Wound Care: none needed  Follow-up:  With Dr. Johna SheriffHoxworth in 3 weeks.  Signed: Mariella SaaBenjamin T Salena Ortlieb MD, FACS  02/23/2016, 8:25 AM

## 2016-02-23 NOTE — Progress Notes (Signed)
Patient alert and oriented, pain is controlled. Patient is tolerating fluids, advanced to protein shake today, patient is tolerating well.  Reviewed Gastric sleeve discharge instructions with patient and patient is able to articulate understanding.  Provided information on BELT program, Support Group and [facility] outpatient pharmacy. All questions answered, will continue to monitor.  

## 2016-03-02 ENCOUNTER — Telehealth (HOSPITAL_COMMUNITY): Payer: Self-pay

## 2016-03-02 NOTE — Telephone Encounter (Signed)
  Attempted DROP discharge call, no answer, left message to return call   Made discharge phone call to patient per DROP protocol. Asking the following questions.    1. Do you have someone to care for you now that you are home?   2. Are you having pain now that is not relieved by your pain medication?   3. Are you able to drink the recommended daily amount of fluids (48 ounces minimum/day) and protein (60-80 grams/day) as prescribed by the dietitian or nutritional counselor?   4. Are you taking the vitamins and minerals as prescribed?   5. Do you have the "on call" number to contact your surgeon if you have a problem or question?   6. Are your incisions free of redness, swelling or drainage? (If steri strips, address that these can fall off, shower as tolerated)  7. Have your bowels moved since your surgery?  If not, are you passing gas?   8. Are you up and walking 3-4 times per day?       

## 2016-03-08 ENCOUNTER — Encounter: Payer: BC Managed Care – PPO | Attending: General Surgery

## 2016-03-08 DIAGNOSIS — Z01818 Encounter for other preprocedural examination: Secondary | ICD-10-CM | POA: Diagnosis not present

## 2016-03-08 NOTE — Progress Notes (Signed)
Bariatric Class:  Appt start time: 1530 end time:  1630.  2 Week Post-Operative Nutrition Class  Patient was seen on 03/08/16 for Post[facility]ative Nutrition [facility].   Surgery date: 02/22/2016 Surgery type: sleeve gastrectomy [facility] weight at [facility]: 395 lbs on 12/22/2015 Weight today: 365.8 lbs  Weight change: 29.6 lbs  TANITA  BODY COMP RESULTS  02/08/16 03/08/16   BMI (kg/m^2) 61.9 57.3   Fat Mass (lbs) 236 213.2   Fat Free Mass (lbs) 159.4 152.6   Total Body Water (lbs) N/A N/A   The following the learning objectives were met by the patient during this course:  Identifies Phase 3A (Soft, High Proteins) Dietary Goals and will begin from 2 weeks post-operatively to 2 months post-operatively  Identifies appropriate sources of fluids and proteins   States protein recommendations and appropriate sources post-operatively  Identifies the need for appropriate texture modifications, mastication, and bite sizes when consuming solids  Identifies appropriate multivitamin and calcium sources post-operatively  Describes the need for physical activity post-operatively and will follow MD recommendations  States when to call healthcare provider regarding medication questions or post-operative complications  Handouts given during class include:  Phase 3A: Soft, High Protein Diet Handout  Follow-Up Plan: Patient will follow-up at [facility] in 6 weeks for 2 month post-op nutrition visit for diet advancement per MD.

## 2016-04-20 ENCOUNTER — Encounter: Payer: BC Managed Care – PPO | Attending: General Surgery | Admitting: Dietician

## 2016-04-20 DIAGNOSIS — Z01818 Encounter for other preprocedural examination: Secondary | ICD-10-CM | POA: Insufficient documentation

## 2016-04-20 DIAGNOSIS — Z6841 Body Mass Index (BMI) 40.0 and over, adult: Secondary | ICD-10-CM

## 2016-04-20 NOTE — Progress Notes (Signed)
  Follow-up visit:  8 Weeks Post-Operative Sleeve Gastrectomy Surgery  Medical Nutrition Therapy:  Appt start time: 0945 end time:  1005.  Primary concerns today: Post-operative Bariatric Surgery Nutrition Management. Returns with a 21.6 lb weight loss. Tried salads, cucumbers and bread which did not sit well. Milk or almond milk is not sitting well either. Also tried cereal.   Has a heel spur which developed since surgery which makes walking hard.   Surgery date: 02/22/2016 Surgery type: sleeve gastrectomy [facility] lbs on 12/22/2015 Weight today: 344.2 lbs   Weight change: 21.6 lbs Total weight loss: 50.8 lbs  TANITA  BODY COMP RESULTS  02/08/16 03/08/16 04/20/16   BMI (kg/m^2) 61.9 57.3 53.9   Fat Mass (lbs) 236 213.2 198.6   Fat Free Mass (lbs) 159.4 152.6 145.6   Total Body Water (lbs) N/A N/A 111.0   Preferred Learning Style:   No preference indicated   Learning Readiness:   Ready  24-hr recall: B (AM): 2 sausage links with a slice of cheese or 1 egg (7-10 g) Snk (AM): none or 2 times per week Premier Protein (30 g) L (PM): Shake if she hadn't had one sooner or 1.5 oz hamburger steak or chicken with veggies or sausage (10 g) Snk (PM): grapes or yogurt or baked cheezits   D (PM): 2 oz meat with green vegetables or beans or taco salad (14 g) Snk (PM): none  Fluid intake: 11 oz protein shake, 32 oz water, 32 oz powerade zero Estimated total protein intake: 60 g  Medications: see list Supplementation: taking, struggling to take in calcium (bari melts)  Using straws: not often  Drinking while eating: tries to wait 30 minutes after eating Hair loss: None Carbonated beverages: tried sips N/V/D/C: vomited after eating too much "new stuff" and nausea if she eats too much  Dumping syndrome: No  Recent physical activity:  Walks 5 days per week about 30 minutes and goes to the gym 2 x week (pool, elliptical)  Progress Towards Goal(s):  In progress.  Handouts  given during visit include:  Phase 3B High Protein + Non Starchy Vegetables   Nutritional Diagnosis:  [place]-3.3 Overweight/obesity related to past poor dietary habits and physical inactivity as evidenced by patient w/ recent sleeve gastrectomy surgery following dietary guidelines for continued weight loss.    Intervention:  Nutrition education/diet advancement. Goals:  Follow Phase 3B: High Protein + Non-Starchy Vegetables  Eat 3-6 small meals/snacks, every 3-5 hrs  Increase lean protein foods to meet 60g goal  Increase fluid intake to 64oz +  Avoid drinking 15 minutes before, during and 30 minutes after eating  Aim for >30 min of physical activity daily  Watch extra carbs - fruit, cheezits, bread    Teaching Method Utilized:  Visual Auditory Hands on  Barriers to learning/adherence to lifestyle change: none  Demonstrated degree of understanding via:  Teach Back   Monitoring/Evaluation:  Dietary intake, exercise, and body weight. Follow up in 6 weeks for 3.5 month post-op visit.

## 2016-04-20 NOTE — Patient Instructions (Addendum)
Goals:  Follow Phase 3B: High Protein + Non-Starchy Vegetables  Eat 3-6 small meals/snacks, every 3-5 hrs  Increase lean protein foods to meet 60g goal  Increase fluid intake to 64oz +  Avoid drinking 15 minutes before, during and 30 minutes after eating  Aim for >30 min of physical activity daily  Watch extra carbs - fruit, cheezits, bread    Surgery date: 02/22/2016 Surgery type: sleeve gastrectomy [facility] lbs on 12/22/2015 Weight today: 344.2 lbs   Weight change: 21.6 lbs Total weight loss: 50.8 lbs  TANITA  BODY COMP RESULTS  02/08/16 03/08/16 04/20/16   BMI (kg/m^2) 61.9 57.3 53.9   Fat Mass (lbs) 236 213.2 198.6   Fat Free Mass (lbs) 159.4 152.6 145.6   Total Body Water (lbs) N/A N/A 111.0

## 2016-06-08 ENCOUNTER — Encounter: Payer: BC Managed Care – PPO | Attending: General Surgery | Admitting: Dietician

## 2016-06-08 DIAGNOSIS — Z01818 Encounter for other preprocedural examination: Secondary | ICD-10-CM | POA: Diagnosis not present

## 2016-06-08 DIAGNOSIS — Z6841 Body Mass Index (BMI) 40.0 and over, adult: Secondary | ICD-10-CM

## 2016-06-08 NOTE — Patient Instructions (Addendum)
Goals:  Follow Phase 3B: High Protein + Non-Starchy Vegetables  Eat 3-6 small meals/snacks, every 3-5 hrs  Increase lean protein foods to meet 60g goal  Continue to work on getting protein   Increase fluid intake to 64oz +  Avoid drinking 15 minutes before, during and 30 minutes after eating  Aim for >30 min of physical activity daily  Surgery date: 02/22/2016 Surgery type: sleeve gastrectomy [facility] lbs on 12/22/2015 Weight today: 325.0 lbs   Weight change: 19.2 lbs Total weight loss: 70.0 lbs  TANITA  BODY COMP RESULTS  02/08/16 03/08/16 04/20/16 06/08/16   BMI (kg/m^2) 61.9 57.3 53.9 50.9   Fat Mass (lbs) 236 213.2 198.6 183.0   Fat Free Mass (lbs) 159.4 152.6 145.6 142.0   Total Body Water (lbs) N/A N/A 111.0 107.6

## 2016-06-08 NOTE — Progress Notes (Signed)
  Follow-up visit:  3.5 Months Post-Operative Sleeve Gastrectomy Surgery  Medical Nutrition Therapy:  Appt start time: 0910 end time:  940  Primary concerns today: Post-operative Bariatric Surgery Nutrition Management. Returns with a 19.2 lb weight loss in past 6 weeks. Not having any issues with foods. Heel spur is feeling better than before. Got some gel inserts.   Tried bread and it does not agree with her. Salad hasn't been working. Carries water with her but feels like she needs to drink more.   Has a heel spur which developed since surgery which makes walking hard.   Will have to start to go to the gym without her mom.   Surgery date: 02/22/2016 Surgery type: sleeve gastrectomy [facility] lbs on 12/22/2015 Weight today: 325.0 lbs   Weight change: 19.2 lbs Total weight loss: 70.0 lbs  TANITA  BODY COMP RESULTS  02/08/16 03/08/16 04/20/16 06/08/16   BMI (kg/m^2) 61.9 57.3 53.9 50.9   Fat Mass (lbs) 236 213.2 198.6 183.0   Fat Free Mass (lbs) 159.4 152.6 145.6 142.0   Total Body Water (lbs) N/A N/A 111.0 107.6   Preferred Learning Style:   No preference indicated   Learning Readiness:   Ready  24-hr recall: B (AM): Premier or whey protein or 2 sausage links  (7-30 g) Snk (AM): none  L (PM): 2 slices meat and 1 slice cheese  (10 g) Snk (PM):protein shake most days (24 g)  D (PM): 1.5 oz meat with green vegetables or beans or taco salad (10 g) Snk (PM): none  Fluid intake: 12-18 oz protein shake, 32 oz water, 32 oz powerade zero (less often) (44-80 oz) Estimated total protein intake: 51-74 g  Medications: see list Supplementation: taking, Still struggling to take in calcium (bari melts)  Using straws: not often  Drinking while eating: tries to wait 30 minutes after eating Hair loss: Yes Carbonated beverages: tried sips N/V/D/C: No Dumping syndrome: No  Recent physical activity:  Walks 5 days per week about 30 minutes and goes to the gym 2 x week (pool,  elliptical)  Progress Towards Goal(s):  In progress.  Handouts given during visit include:  none   Nutritional Diagnosis:  [place]-3.3 Overweight/obesity related to past poor dietary habits and physical inactivity as evidenced by patient w/ recent sleeve gastrectomy surgery following dietary guidelines for continued weight loss.    Intervention:  Nutrition education/diet advancement. Goals:  Follow Phase 3B: High Protein + Non-Starchy Vegetables  Eat 3-6 small meals/snacks, every 3-5 hrs  Increase lean protein foods to meet 60g goal  Continue to work on getting protein   Increase fluid intake to 64oz +  Avoid drinking 15 minutes before, during and 30 minutes after eating  Aim for >30 min of physical activity daily  Teaching Method Utilized:  Visual Auditory Hands on  Barriers to learning/adherence to lifestyle change: none  Demonstrated degree of understanding via:  Teach Back   Monitoring/Evaluation:  Dietary intake, exercise, and body weight. Follow up in 3 Months for 6.5 month post-op visit.

## 2016-07-29 ENCOUNTER — Encounter: Payer: Self-pay | Admitting: Family Medicine

## 2016-07-29 ENCOUNTER — Ambulatory Visit (INDEPENDENT_AMBULATORY_CARE_PROVIDER_SITE_OTHER): Payer: BC Managed Care – PPO | Admitting: Family Medicine

## 2016-07-29 VITALS — BP 100/58 | HR 82 | Temp 100.6°F | Resp 18 | Ht 67.0 in | Wt 307.0 lb

## 2016-07-29 DIAGNOSIS — J029 Acute pharyngitis, unspecified: Secondary | ICD-10-CM | POA: Diagnosis not present

## 2016-07-29 DIAGNOSIS — R509 Fever, unspecified: Secondary | ICD-10-CM | POA: Diagnosis not present

## 2016-07-29 LAB — STREP GROUP A AG, W/REFLEX TO CULT: STREGTOCOCCUS GROUP A AG SCREEN: DETECTED — AB

## 2016-07-29 MED ORDER — AMOXICILLIN 400 MG/5ML PO SUSR: 880.0000 mg | Freq: Two times a day (BID) | ORAL | 0 refills | Status: DC

## 2016-07-29 NOTE — Progress Notes (Signed)
   Subjective:    Patient ID: Alexandra Newton, female    DOB: 07/18/1991, 25 y.o.   MRN: [id number]  HPI  Patient had a virus for about a week. However this morning she woke up with a very sore throat. She now has +3 tonsils on examination which are bright erythematous and red and extremely tender. She also reports severe sore throat. 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 Other than that she has some mild rhinorrhea Past Medical History:  Diagnosis Date  . Allergy    seasonal  . Anxiety   . Borderline anemia   . Depression   . History of bronchitis as a child   . Panic attacks    Past Surgical History:  Procedure Laterality Date  . LAPAROSCOPIC GASTRIC SLEEVE RESECTION N/A 02/22/2016   Procedure: LAPAROSCOPIC GASTRIC SLEEVE RESECTION WITH UPPER ENDO;  Surgeon: Glenna FellowsBenjamin Hoxworth, MD;  Location: [facility];  Service: General;  Laterality: N/A;  . WISDOM TOOTH EXTRACTION     4   No current outpatient prescriptions on file prior to visit.   No current facility-administered medications on file prior to visit.    No Known Allergies Social History   Social History  . Marital status: Single    Spouse name: N/A  . Number of children: N/A  . Years of education: N/A   Occupational History  . Not on file.   Social History Main Topics  . Smoking status: Never Smoker  . Smokeless tobacco: Never Used  . Alcohol use 0.0 oz/week     Comment: rarely  . Drug use: No  . Sexual activity: No   Other Topics Concern  . Not on file   Social History Narrative  . No narrative on file     Review of Systems  All other systems reviewed and are negative.      Objective:   Physical Exam  HENT:  Right Ear: Tympanic membrane, external ear and ear canal normal.  Left Ear: Tympanic membrane, external ear and ear canal normal.  Nose: Mucosal edema and rhinorrhea present.  Mouth/Throat: Posterior oropharyngeal edema and posterior oropharyngeal erythema  present. No tonsillar abscesses.  Eyes: Conjunctivae are normal.  Neck: Neck supple.  Cardiovascular: Normal rate, regular rhythm and normal heart sounds.   Pulmonary/Chest: Effort normal and breath sounds normal.  Lymphadenopathy:    She has cervical adenopathy.  Vitals reviewed.         Assessment & Plan:  Sore throat - Plan: STREP GROUP A AG, W/REFLEX TO CULT  Fever, unspecified fever cause - Plan: STREP GROUP A AG, W/REFLEX TO CULT  Patient has to drink a liquid due to her recent gastric sleeve. Therefore I'll put her on amoxicillin 400 mg per 5 ML's, she will take 11 mL twice daily for 10 days to treat what appears to be strep tonsillitis. Her exam is impressive and therefore I will ignore the results of the strep test irregardless. She has +3 tonsils.

## 2016-08-31 ENCOUNTER — Ambulatory Visit: Payer: BC Managed Care – PPO | Admitting: Dietician

## 2016-09-05 ENCOUNTER — Encounter: Payer: BC Managed Care – PPO | Attending: General Surgery | Admitting: Dietician

## 2016-09-05 ENCOUNTER — Encounter: Payer: Self-pay | Admitting: Dietician

## 2016-09-05 DIAGNOSIS — Z6841 Body Mass Index (BMI) 40.0 and over, adult: Secondary | ICD-10-CM

## 2016-09-05 DIAGNOSIS — Z713 Dietary counseling and surveillance: Secondary | ICD-10-CM | POA: Insufficient documentation

## 2016-09-05 NOTE — Patient Instructions (Addendum)
Goals:  Follow Phase 3B: High Protein + Non-Starchy Vegetables  Eat 3-6 small meals/snacks, every 3-5 hrs  Increase lean protein foods to meet 60g goal  Continue to work on getting protein   Increase fluid intake to 64oz +  Avoid drinking 15 minutes before, during and 30 minutes after eating  Aim for >30 min of physical activity daily  Try another Calcium supplement and find one that you like  Multivitamin: Celebrate or Bariatric Advantage capsules   Surgery date: 02/22/2016 Surgery type: sleeve gastrectomy [facility]: [facility] lbs on 12/22/2015 Weight today: 299.8 lbs Weight change: 25 lbs Total weight loss: 100 lbs  TANITA  BODY COMP RESULTS  02/08/16 03/08/16 04/20/16 06/08/16 09/05/16   BMI (kg/m^2) 61.9 57.3 53.9 50.9 47   Fat Mass (lbs) 236 213.2 198.6 183.0 161   Fat Free Mass (lbs) 159.4 152.6 145.6 142.0 138.8   Total Body Water (lbs) N/A N/A 111.0 107.6 104.4

## 2016-09-05 NOTE — Progress Notes (Signed)
  Follow-up visit:  6 Months Post-Operative Sleeve Gastrectomy Surgery  Medical Nutrition Therapy:  Appt start time: 0910 end time:  935  Primary concerns today: Post-operative Bariatric Surgery Nutrition Management.  Alexandra RuizLeah returns having lost another 25 pounds in the last 3 months. She reports she is excited to have lost 100 pounds. Noticing weight loss plateaus, was losing 3-4 lbs a week and started noticing plateau around New Years. She states that she understands that this is normal and feeling okay about her weight. Has occasional snack foods like goldfish.  Notices that she wants to eat out of boredom and works to find other things to do besides eat. Meeting protein needs with the help of a protein shake for breakfast. Still working on meeting fluid needs consistently. Notes that she is very thirsty right before bed and in the middle of the night. Does not have to urinate in the night.   Surgery date: 02/22/2016 Surgery type: sleeve gastrectomy [facility]: [facility] lbs on 12/22/2015 Weight today: 299.8 lbs Weight change: 25 lbs Total weight loss: 100 lbs  TANITA  BODY COMP RESULTS  02/08/16 03/08/16 04/20/16 06/08/16 09/05/16   BMI (kg/m^2) 61.9 57.3 53.9 50.9 47   Fat Mass (lbs) 236 213.2 198.6 183.0 161   Fat Free Mass (lbs) 159.4 152.6 145.6 142.0 138.8   Total Body Water (lbs) N/A N/A 111.0 107.6 104.4   Preferred Learning Style:   No preference indicated   Learning Readiness:   Ready  24-hr recall: B (AM): Premier protein shake (30 g) Snk (10-10:30 AM): Slim Jim or cheese and pepperoni (10g) L (PM): 1-1.5 oz hamburger patty and broccoli (10g) Snk (PM): apple and peanut butter D (PM): 1-1.5 oz meat with green vegetables (10 g) Snk (PM): none  Fluid intake: 12-18 oz protein shake, 32 oz water, 32 oz powerade zero (less often) (44-80 oz) Estimated total protein intake: 60 g (patient reports that she meets needs when she has a shake)  Medications: see list Supplementation:  taking, Still struggling to take in calcium (bari melts)  Using straws: not often  Drinking while eating: tries to wait 30 minutes after eating Hair loss: Yes Carbonated beverages: tried sips N/V/D/C: No Dumping syndrome: No  Recent physical activity:  Gym: treadmill, bike, elliptical, weights; usually 2 days a week for 45-60 minutes  Progress Towards Goal(s):  In progress.  Handouts given during visit include:  none   Nutritional Diagnosis:  [place]-3.3 Overweight/obesity related to past poor dietary habits and physical inactivity as evidenced by patient w/ recent sleeve gastrectomy surgery following dietary guidelines for continued weight loss.    Intervention:  Nutrition education/diet advancement.  Teaching Method Utilized:  Visual Auditory Hands on  Barriers to learning/adherence to lifestyle change: none  Demonstrated degree of understanding via:  Teach Back   Monitoring/Evaluation:  Dietary intake, exercise, and body weight. Follow up in 3 Months for 9 month post-op visit.

## 2016-09-12 ENCOUNTER — Ambulatory Visit: Payer: BC Managed Care – PPO | Admitting: Dietician

## 2016-12-06 ENCOUNTER — Encounter: Payer: BC Managed Care – PPO | Attending: General Surgery | Admitting: Skilled Nursing Facility1

## 2016-12-06 ENCOUNTER — Encounter: Payer: Self-pay | Admitting: Skilled Nursing Facility1

## 2016-12-06 DIAGNOSIS — Z9884 Bariatric surgery status: Secondary | ICD-10-CM | POA: Diagnosis present

## 2016-12-06 NOTE — Progress Notes (Signed)
  Follow-up visit:  6 Months Post-Operative Sleeve Gastrectomy Surgery  Medical Nutrition Therapy:  Appt start time: 0910 end time:  935  Primary concerns today: Post-operative Bariatric Surgery Nutrition Management.   Pt states she is getting a promotion at work. Pt states she feels like she is under a lot of stress. Pt states she has had 3 panic attacks lately. Pt states she has lost 120 pounds. Pt states her mom works for Dr[employer]. Pt states her father is in poor health/struggles with alcoholism and she lives with him and her sister. Pt states she is stressed about the hole relying on food has left and how to deal with that.   Surgery date: 02/22/2016 Surgery type: sleeve gastrectomy Start weight at [facility]: 395 lbs on 12/22/2015 Weight today: 280.4 lbs Weight change: 19.5 lbs Total weight loss: 115.6  lbs  TANITA  BODY COMP RESULTS  02/08/16 03/08/16 04/20/16 06/08/16 09/05/16 12/06/2016   BMI (kg/m^2) 61.9 57.3 53.9 50.9 47 43.9   Fat Mass (lbs) 236 213.2 198.6 183.0 161 147.2   Fat Free Mass (lbs) 159.4 152.6 145.6 142.0 138.8 133.2   Total Body Water (lbs) N/A N/A 111.0 107.6 104.4 99.6   Preferred Learning Style:   No preference indicated   Learning Readiness:   Ready  24-hr recall: B (5-6AM): Premier protein shake (30 g) Snk (10-10:30 AM): Slim Jim or cheese and wheat crackers and pepperoni (10g) L (PM): eggs in salad OR 1-1.5 oz hamburger patty and broccoli (10g) Snk (PM): apple and peanut butter (not usual) D (PM): 1-1.5 oz meat with green vegetables with cheese (10 g) Snk (PM): none  Fluid intake: 12-18 oz protein shake, 32 oz water, 32 oz powerade zero (less often) (44-80 oz) Estimated total protein intake: 60 g (patient reports that she meets needs when she has a shake)  Medications: see list Supplementation: calcium pill and centrum complete and b12  Using straws: not often  Drinking while eating: tries to wait 30 minutes after eating Hair loss: Yes Carbonated  beverages: tried sips N/V/D/C: Every once and a while when eating too much, no, no, no Dumping syndrome: No Having you been chewing well: yes Chewing/swallowing difficulties: no Changes in vision: no Changes to mood/headaches: no Hair loss/Cahnges to skin/Changes to nails: no Any difficulty focusing or concentrating: no Sweating: no Dizziness/Lightheaded: no Palpitations: no  Carbonated beverages: no Abdominal Pain: no  Recent physical activity:  Gym: treadmill, bike, elliptical, weights; usually 2 days a week for 45-60 minutes  Progress Towards Goal(s):  In progress.  Handouts given during visit include:  none   Nutritional Diagnosis:  -3.3 Overweight/obesity related to past poor dietary habits and physical inactivity as evidenced by patient w/ recent sleeve gastrectomy surgery following dietary guidelines for continued weight loss.    Intervention:  Nutrition education/diet advancement. Goals: Talk to a therapist Start on a bariatric specific multivitamin   Teaching Method Utilized:  Visual Auditory Hands on  Barriers to learning/adherence to lifestyle change: none  Demonstrated degree of understanding via:  Teach Back   Monitoring/Evaluation:  Dietary intake, exercise, and body weight. Follow up in 3 Months for 9 month post-op visit.

## 2016-12-06 NOTE — Patient Instructions (Signed)
-  start the bariatric specific multivitamin when you finish with your multi

## 2017-04-24 ENCOUNTER — Ambulatory Visit (INDEPENDENT_AMBULATORY_CARE_PROVIDER_SITE_OTHER): Payer: BC Managed Care – PPO | Admitting: Family Medicine

## 2017-04-24 ENCOUNTER — Encounter: Payer: Self-pay | Admitting: Family Medicine

## 2017-04-24 VITALS — BP 108/70 | HR 84 | Temp 98.0°F | Resp 16 | Ht 67.0 in | Wt 266.2 lb

## 2017-04-24 DIAGNOSIS — F330 Major depressive disorder, recurrent, mild: Secondary | ICD-10-CM | POA: Diagnosis not present

## 2017-04-24 DIAGNOSIS — F419 Anxiety disorder, unspecified: Secondary | ICD-10-CM

## 2017-04-24 MED ORDER — VENLAFAXINE HCL 37.5 MG PO TABS: 37.5000 mg | ORAL_TABLET | Freq: Two times a day (BID) | ORAL | 2 refills | Status: DC

## 2017-04-24 NOTE — Patient Instructions (Signed)
F/U 4 weeks  

## 2017-04-24 NOTE — Assessment & Plan Note (Signed)
Generalized anxiety as well as depressive symptoms. PH Q9 score was 22. I will start her on venlafaxine twice a day as she has had bariatric surgery and cannot be on extended release medication. Also they should not affect her weight loss. Follow-up in a few weeks she can call by phone if there is any difficulties with the medication. We did discuss the typical side effects.
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
## 2017-05-18 IMAGING — CR DG CHEST 2V
2 series · 2 of 2 positions shown · non-contrast
Comparison: None.

CLINICAL DATA: Morbid obesity preop

EXAM:
CHEST  2 VIEW

[w chest pa *]
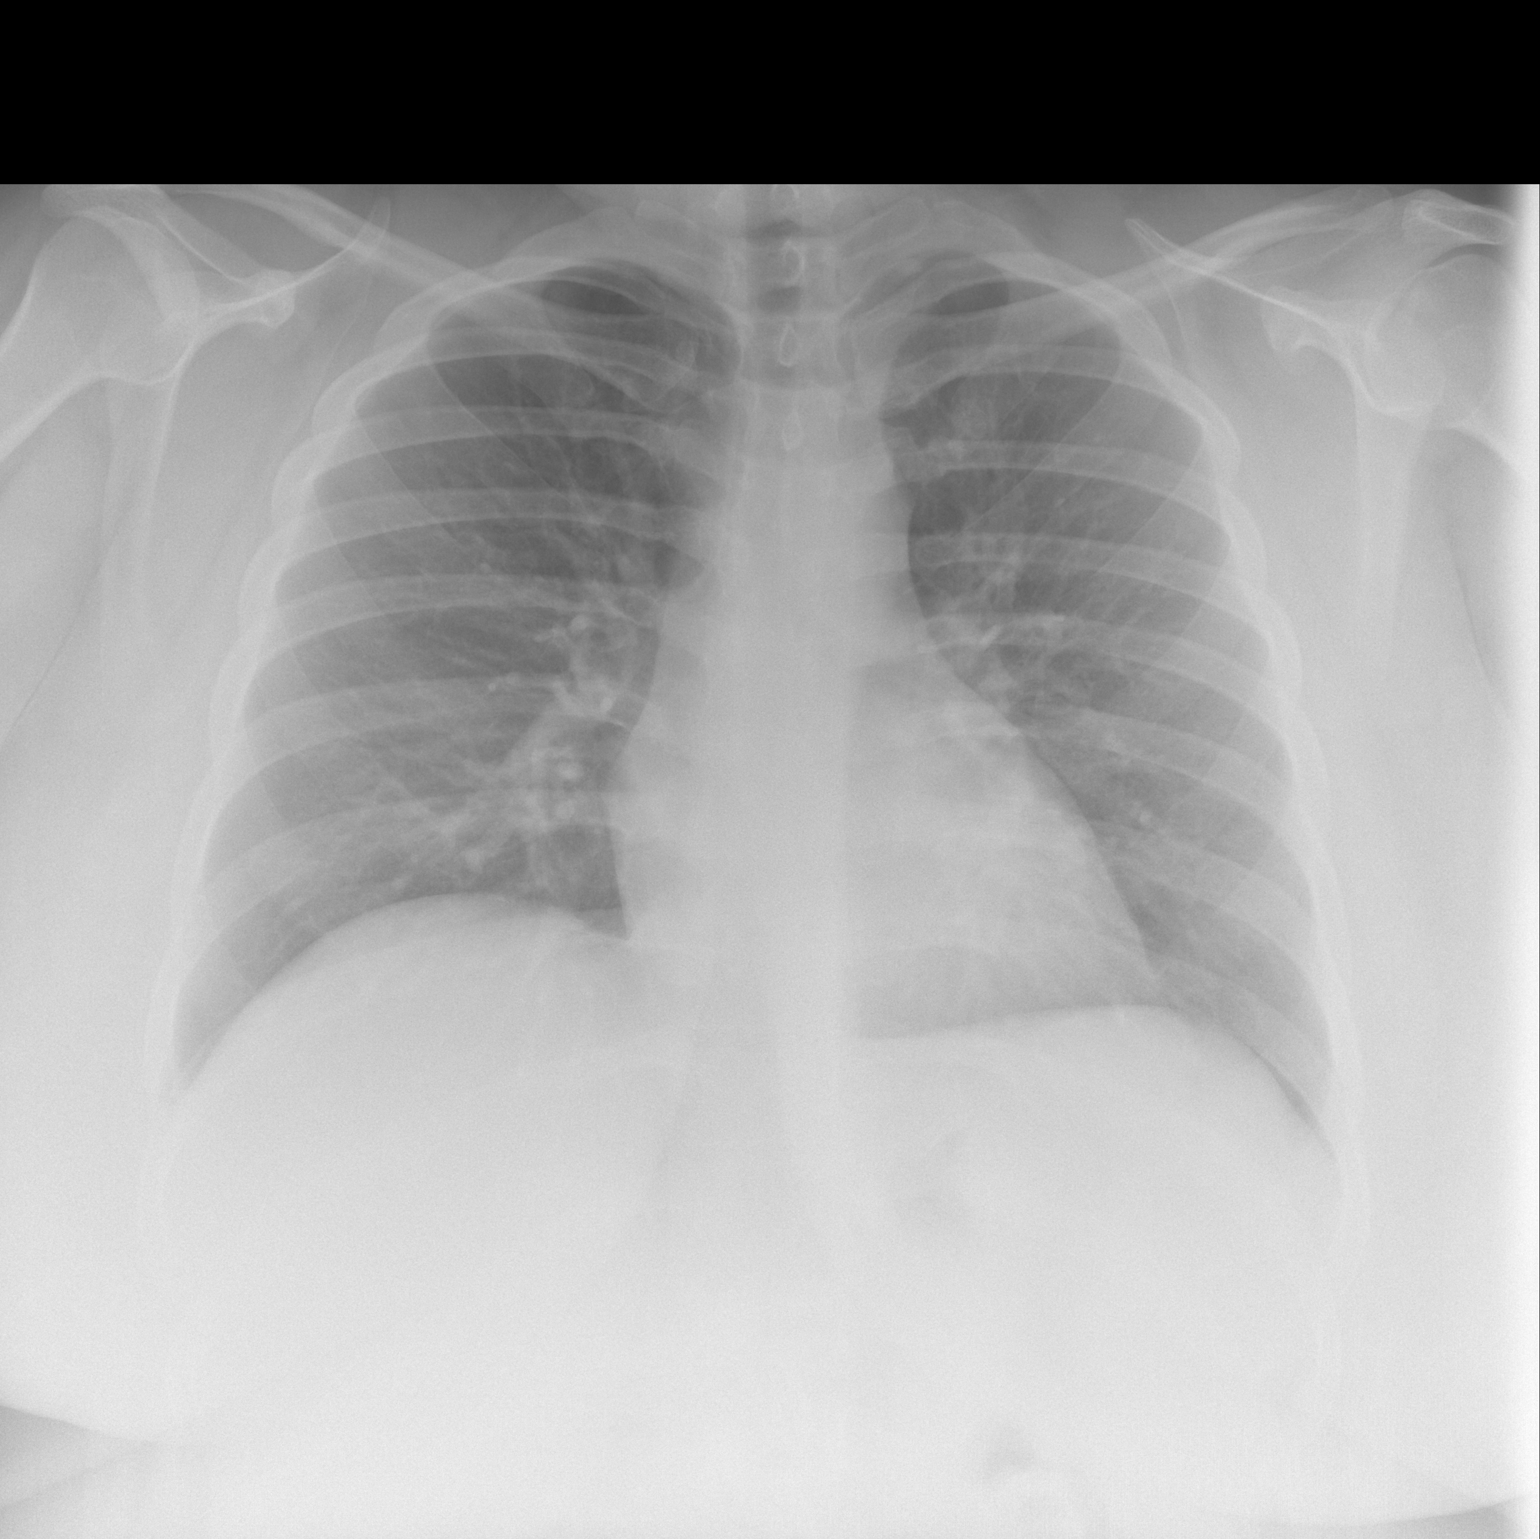

[w chest lat *]
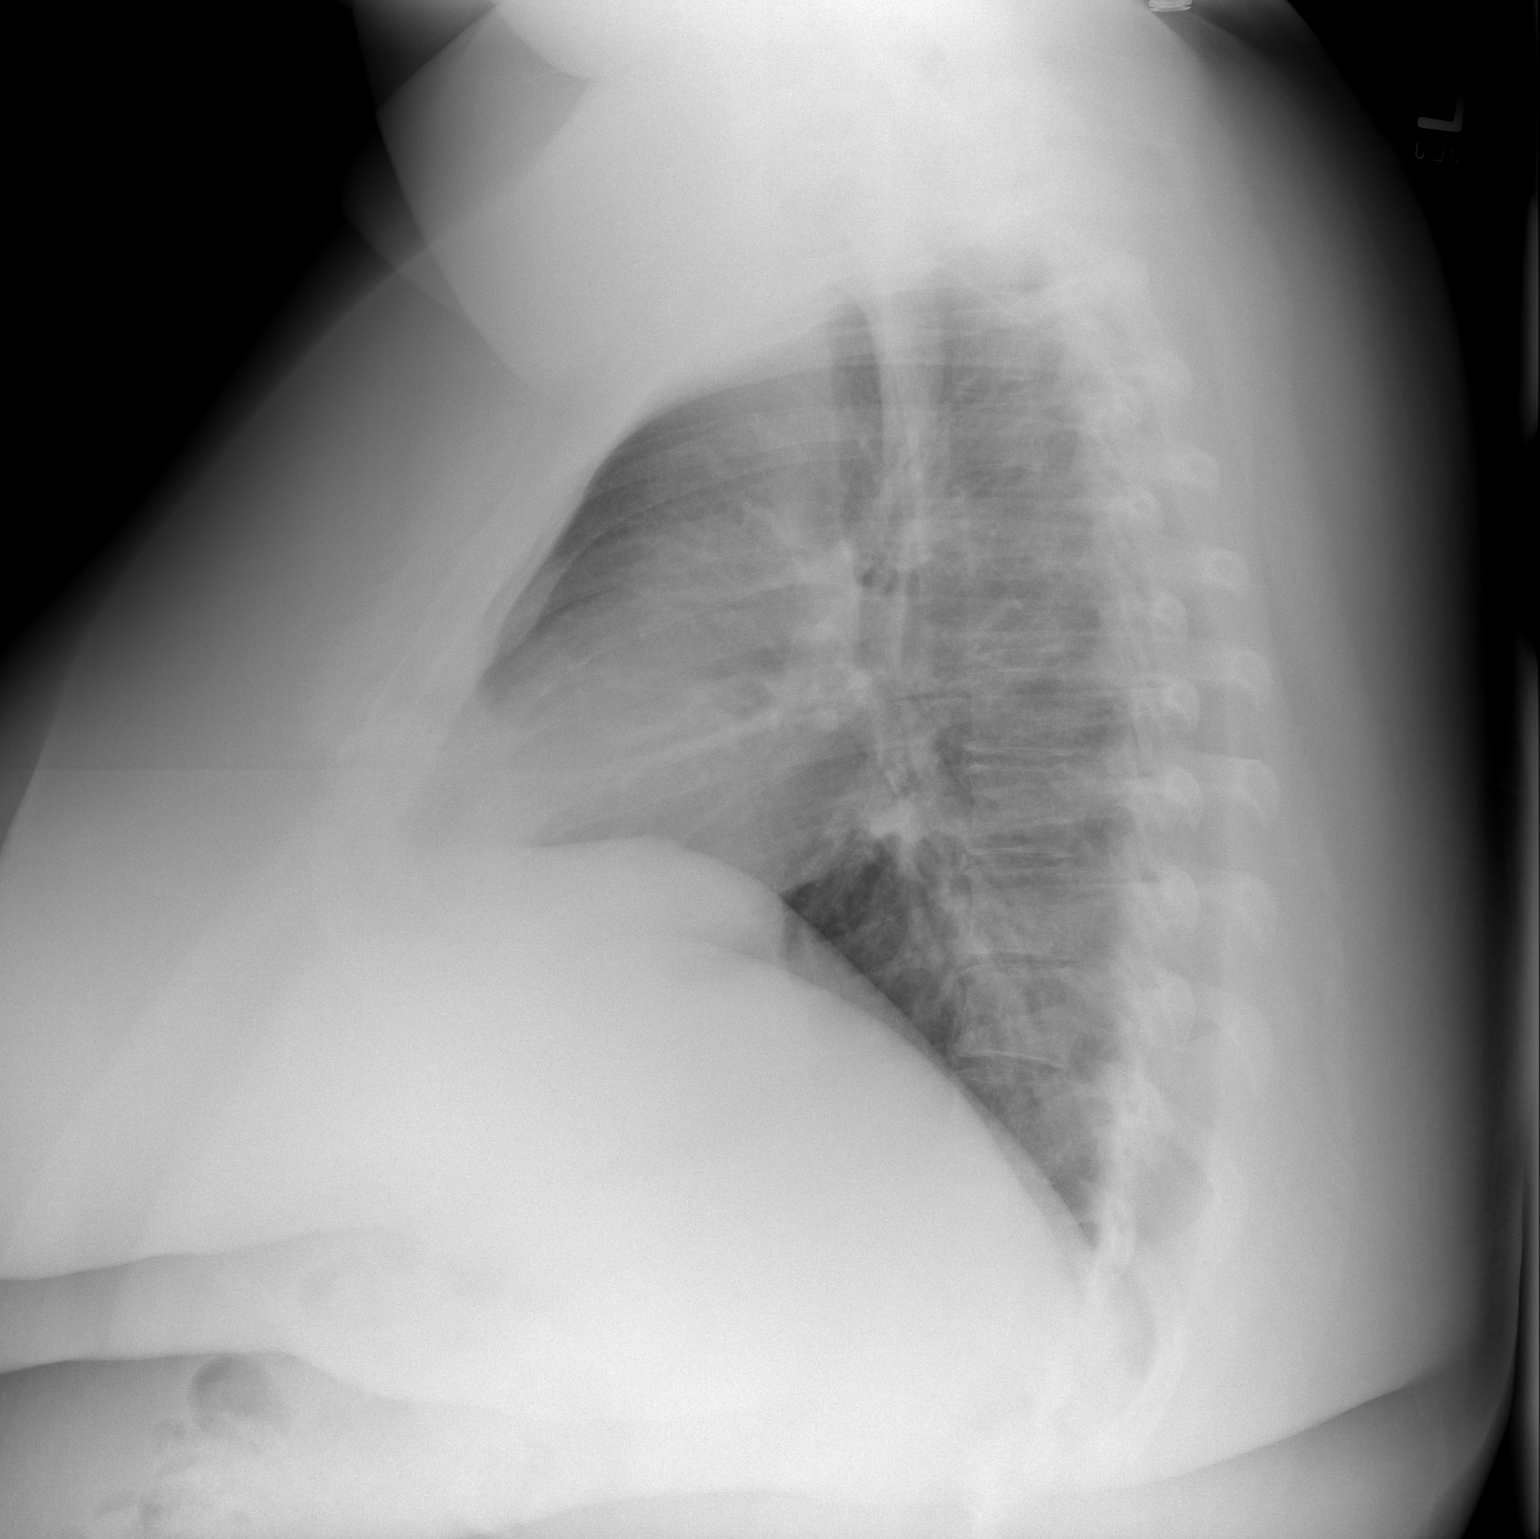

[2 of 2 positions shown; findings below may reference images not displayed]

FINDINGS: The heart size and mediastinal contours are within normal limits.
Both lungs are clear. The visualized skeletal structures are
unremarkable.
IMPRESSION: No active cardiopulmonary disease.

## 2017-05-18 IMAGING — RF DG UGI W/ KUB
11 of 16 series · 13 of 24 positions shown · non-contrast
Comparison: Chest radiograph 11/27/2015

CLINICAL DATA: 25-year-old female undergoing evaluation for
bariatric surgery. Asymptomatic. Initial encounter.

EXAM:
UPPER GI SERIES WITH KUB
TECHNIQUE: After obtaining a scout radiograph a routine upper GI series was
performed using thin barium
FLUOROSCOPY TIME:  Radiation Exposure Index (as provided by the
fluoroscopic device): 107.9 mGy
If the device does not provide the exposure index:
Fluoroscopy Time (in minutes and seconds):  1 minutes 54 seconds

[Series 1: t abdomen supine · 0.15mm/px · 1 of 1 slices shown]
[im 1/1]
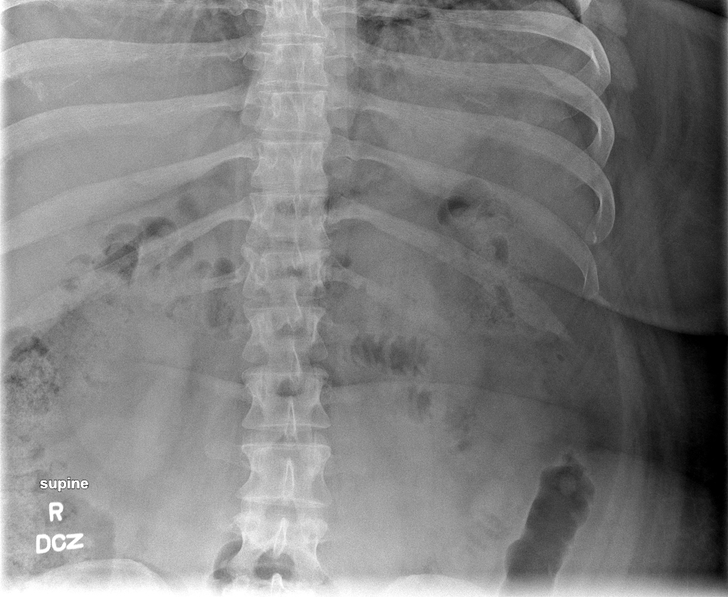

[Series 2: cp_standard · 0.34mm/px · 1 of 90 frames shown (1 of 10)]
[frame 46/90]
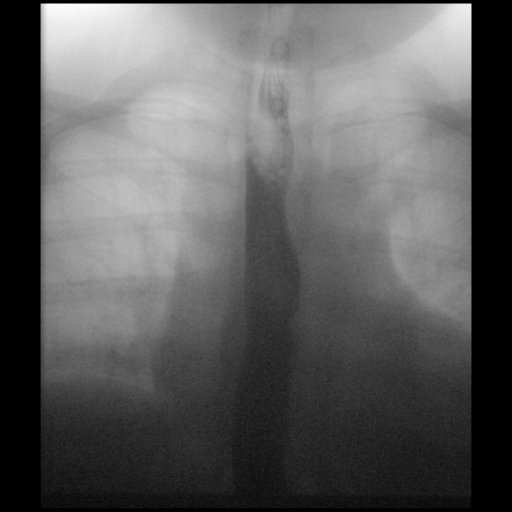

[Series 4: cp_standard · 0.17mm/px · 1 of 1 slices shown (2 of 10)]
[im 1/1]
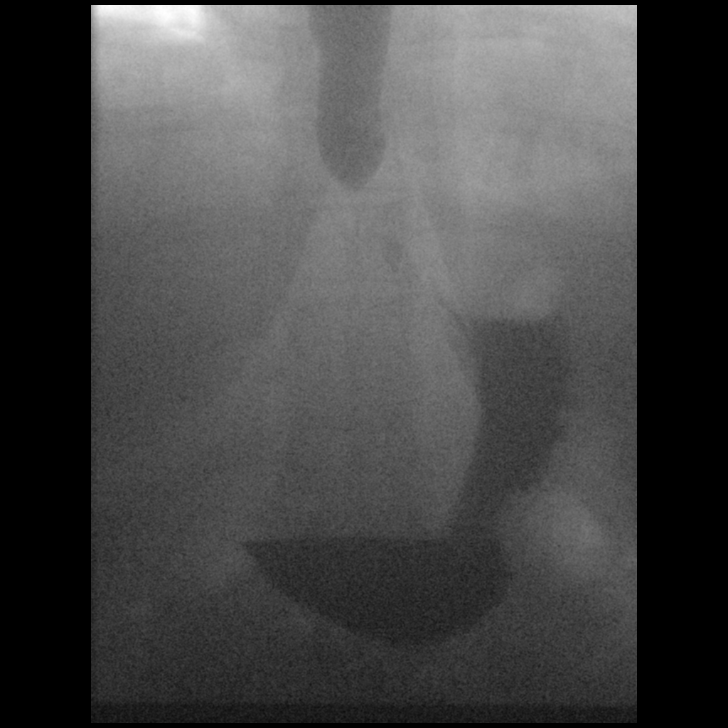

[Series 5: cp_standard · 0.34mm/px · 1 of 28 frames shown (3 of 10)]
[frame 15/28]
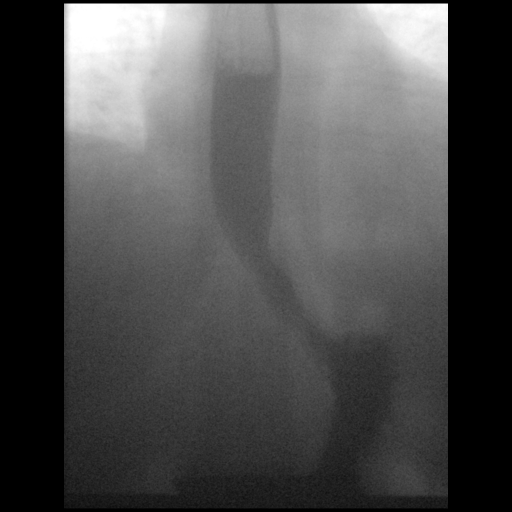

[Series 6: cp_standard · 0.34mm/px · 2 of 91 frames shown (4 of 10)]
[frame 14/91]
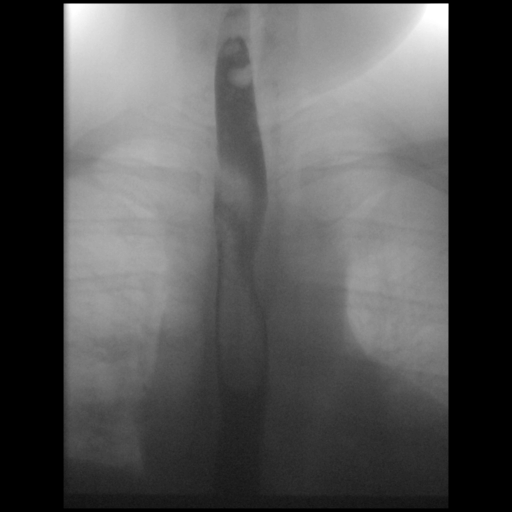
[frame 78/91]
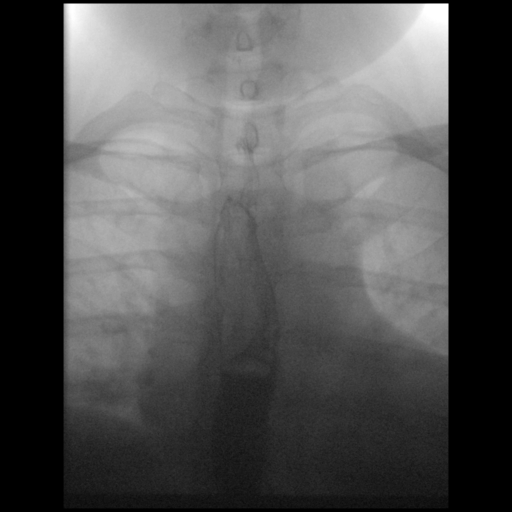

[Series 8: cp_standard · 0.34mm/px · 2 of 22 frames shown (5 of 10)]
[frame 8/22]
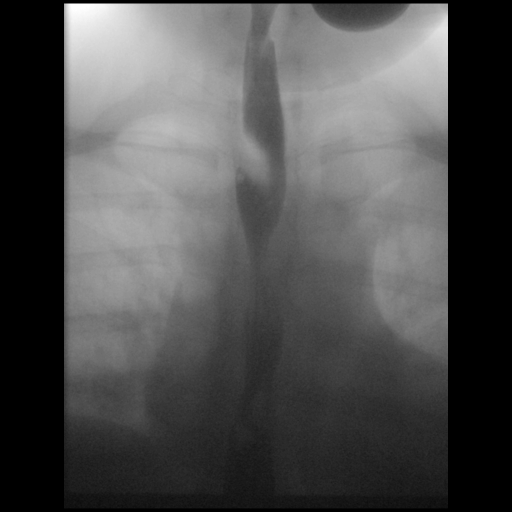
[frame 19/22]
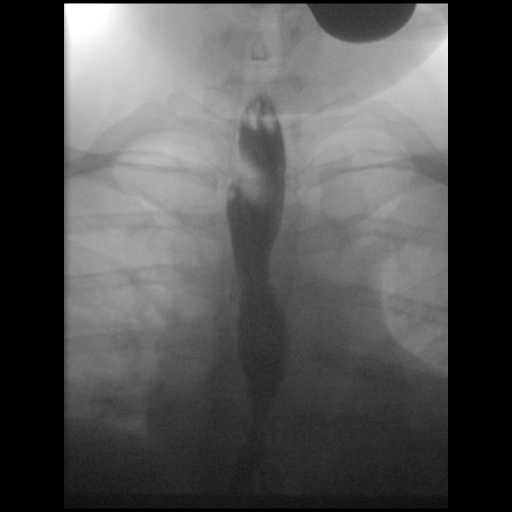

[Series 11: cp_standard · 0.25mm/px · 1 of 1 slices shown (6 of 10)]
[im 1/1]
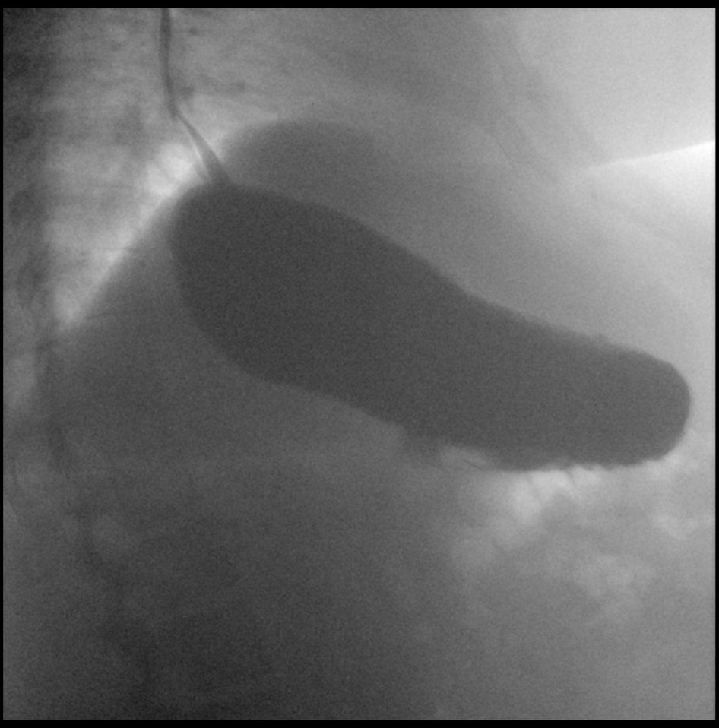

[Series 13: cp_standard · 0.17mm/px · 1 of 1 slices shown (7 of 10)]
[im 1/1]
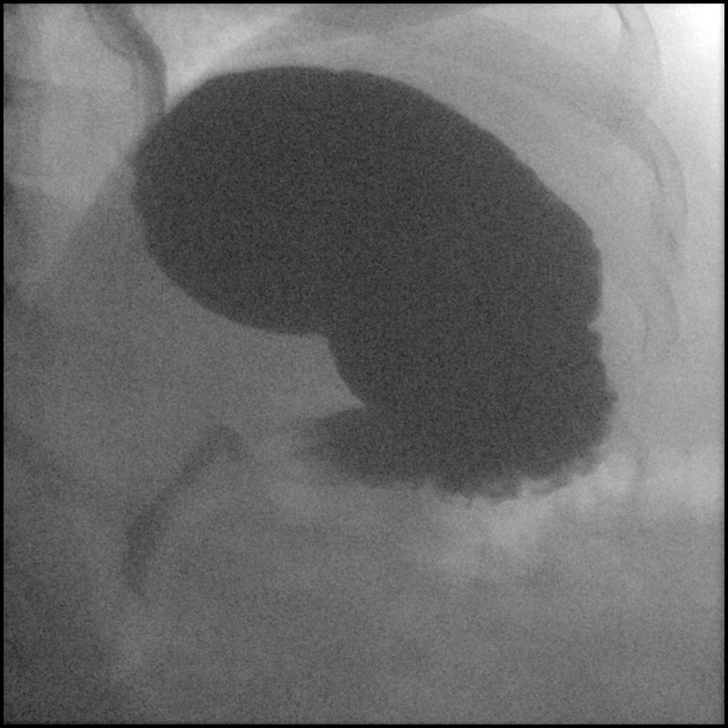

[Series 16: cp_standard · 0.17mm/px · 1 of 1 slices shown (8 of 10)]
[im 1/1]
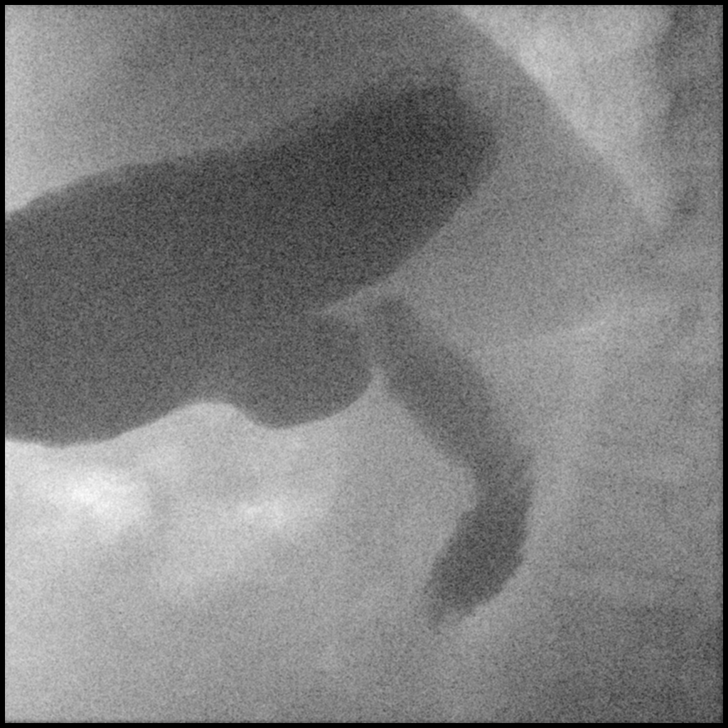

[Series 19: cp_standard · 0.17mm/px · 1 of 1 slices shown (9 of 10)]
[im 1/1]
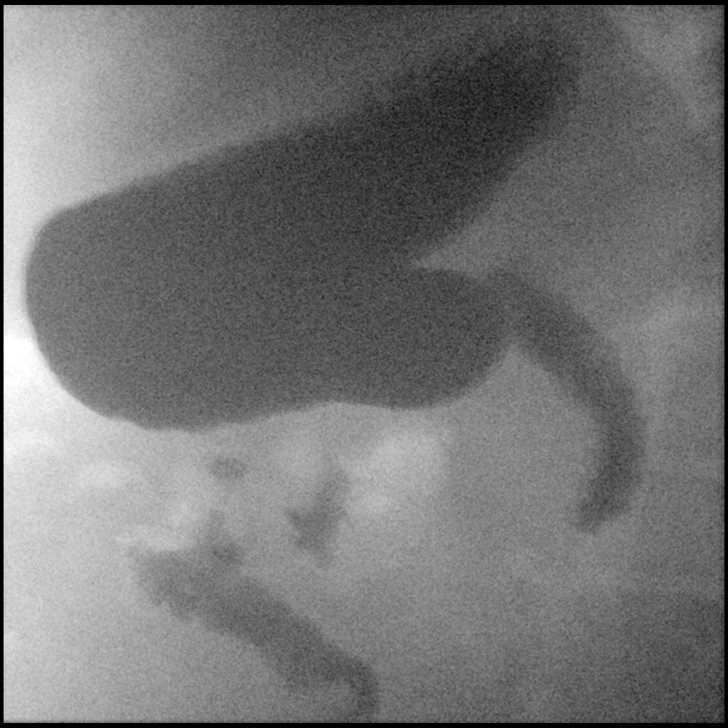

[Series 22: cp_standard · 0.17mm/px · 1 of 1 slices shown (10 of 10)]
[im 1/1]
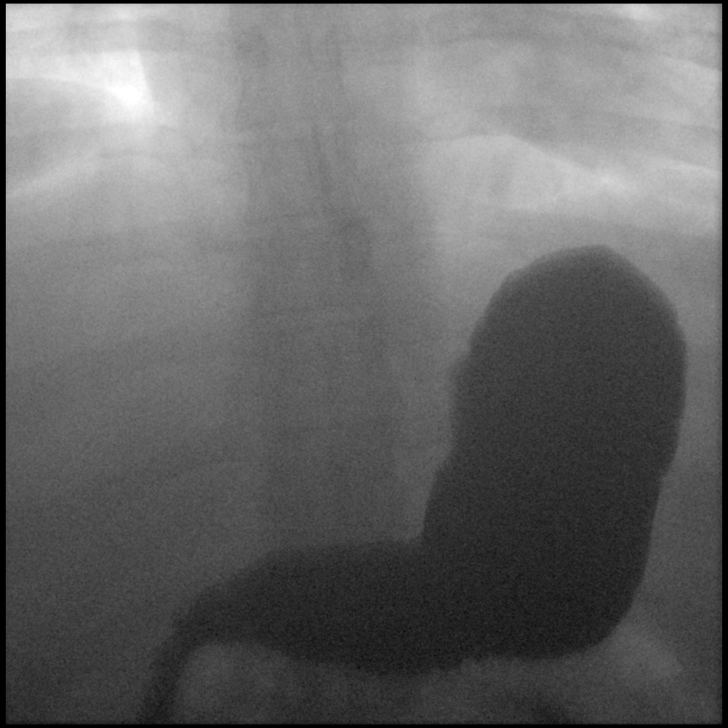

[13 of 24 positions shown; findings below may reference images not displayed]

FINDINGS: Preprocedural scout view of the abdomen. Non obstructed bowel gas
pattern. Normal abdominal visceral contours. No osseous abnormality
identified.

A single contrast study was undertaken and the patient tolerated
this well and without difficulty.

No obstruction to the forward flow of contrast throughout the
esophagus and into the stomach. Normal esophageal course and in
contour aside from a oblique linear filling defect along the
esophagus at the thoracic inlet directed superiorly into the right
suggestive of an aberrant origin of the right subclavian artery
(normal anatomic variant).

Normal gastroesophageal junction. Good gastric filling with
contrast. Normal gastric contour. Fairly prompt gastric emptying.
Normal retroperitoneal course of the duodenum. Normal C-loop. Normal
position of the ligament of Treitz.

No gastroesophageal reflux occurred spontaneously or was elicited.

Visible jejunal opacification appeared normal.
IMPRESSION: 1. Normal gastric and duodenal anatomy.
2. Aberrant origin of the right subclavian artery (normal variant)
strongly suspected. Otherwise normal esophagus.

## 2017-05-22 ENCOUNTER — Ambulatory Visit (INDEPENDENT_AMBULATORY_CARE_PROVIDER_SITE_OTHER): Payer: BC Managed Care – PPO | Admitting: Family Medicine

## 2017-05-22 ENCOUNTER — Encounter: Payer: Self-pay | Admitting: Family Medicine

## 2017-05-22 VITALS — BP 100/58 | HR 66 | Temp 98.9°F | Resp 16 | Ht 67.0 in | Wt 269.0 lb

## 2017-05-22 DIAGNOSIS — E7439 Other disorders of intestinal carbohydrate absorption: Secondary | ICD-10-CM | POA: Diagnosis not present

## 2017-05-22 DIAGNOSIS — F330 Major depressive disorder, recurrent, mild: Secondary | ICD-10-CM | POA: Diagnosis not present

## 2017-05-22 DIAGNOSIS — R5383 Other fatigue: Secondary | ICD-10-CM | POA: Diagnosis not present

## 2017-05-22 DIAGNOSIS — F419 Anxiety disorder, unspecified: Secondary | ICD-10-CM | POA: Diagnosis not present

## 2017-05-22 DIAGNOSIS — D509 Iron deficiency anemia, unspecified: Secondary | ICD-10-CM | POA: Diagnosis not present

## 2017-05-22 DIAGNOSIS — Z9884 Bariatric surgery status: Secondary | ICD-10-CM

## 2017-05-22 MED ORDER — VENLAFAXINE HCL 75 MG PO TABS: 75.0000 mg | ORAL_TABLET | Freq: Two times a day (BID) | ORAL | 3 refills | Status: DC

## 2017-05-22 NOTE — Assessment & Plan Note (Signed)
Depression with anxiety Increase effexor to  BID and see how she does Also discussed psychotherapy and she is willing to try  She is to call if she notices any worsening of symptoms with dose increase of the medication

## 2017-05-22 NOTE — Patient Instructions (Signed)
Send me update in 2 weeks via mychart Referral to therapy F/u 2 month

## 2017-05-22 NOTE — Progress Notes (Signed)
Subjective:    Patient ID: Alexandra Newton, female    DOB: [birth year], 26 y.o.   MRN: [id number]  Patient presents for Medication Management and Follow-up Patient here to follow-up medications. She was seen about a month ago at the time she is expressing a lot of increased anxiety and depression symptoms have been going on over the past 6 months. She's had difficulty on her journey with her weight loss since her surgery and her psychologist recommended her coming in to discuss medications as well. She was started on venlafaxine 37.5 mg twice a day She has seen some improvement in her anxiety in the amount of panic attack she is having still feels on edge. States she is not sleeping very well cannot turn her mind off. She also noted that she was clenching her jaw also was not sure that this is a side effect of the medication or just her anxiety in general. She has also felt more hungry recently. She also complains of fatigue she is only taking her multivitamin she is not on any other supplements since her weight loss surgery.    - Prolonged bruising, had a fall 2 weeks ago, still has bruises above her knees, seems it is taking longer to go away   Review Of Systems:  GEN- + fatigue, denies fever, weight loss,weakness, recent illness HEENT- denies eye drainage, change in vision, nasal discharge, CVS- denies chest pain, palpitations RESP- denies SOB, cough, wheeze ABD- denies N/V, change in stools, abd pain GU- denies dysuria, hematuria, dribbling, incontinence MSK- denies joint pain, muscle aches, injury Neuro- denies headache, dizziness, syncope, seizure activity       Objective:    BP (!) 100/58   Pulse 66   Temp 98.9 F (37.2 C) (Oral)   Resp 16   Ht  (1.702 m)   Wt 269 lb (122 kg)   BMI 42.13 kg/m  GEN- NAD, alert and oriented x3 HEENT- PERRL, EOMI, non injected sclera, pink conjunctiva, MMM, oropharynx clear Neck- Supple, no thyromegaly CVS- RRR, no  murmur RESP-CTAB Psych-normal affect and mood         Assessment & Plan:      Problem List Items Addressed This Visit      Unprioritized   Anxiety   Relevant Medications   venlafaxine (EFFEXOR) 75 MG tablet   Other Relevant Orders   Ambulatory referral to Psychiatry   Depression    Depression with anxiety Increase effexor to  BID and see how she does Also discussed psychotherapy and she is willing to try  She is to call if she notices any worsening of symptoms with dose increase of the medication       Relevant Medications   venlafaxine (EFFEXOR) 75 MG tablet   Other Relevant Orders   Ambulatory referral to Psychiatry    Other Visit Diagnoses    Iron deficiency anemia, unspecified iron deficiency anemia type    -  Primary   with fagtigue and s/p bariatric surgery check Hb, iron stores, vitamin B12 levels   Relevant Orders   Vitamin B12   Iron, TIBC and Ferritin Panel   Glucose intolerance       Relevant Orders   Hemoglobin A1c   Other fatigue       Relevant Orders   Comprehensive metabolic panel   CBC with Differential/Platelet   TSH   S/P bariatric surgery       Relevant Orders   Comprehensive metabolic panel   CBC with Differential/Platelet  Note: This dictation was prepared with Dragon dictation along with smaller phrase technology. Any transcriptional errors that result from this process are unintentional.

## 2017-05-23 LAB — COMPREHENSIVE METABOLIC PANEL
AG RATIO: 1.3 (calc) (ref 1.0–2.5)
ALBUMIN MSPROF: 3.9 g/dL (ref 3.6–5.1)
ALT: 9 U/L (ref 6–29)
AST: 11 U/L (ref 10–30)
Alkaline phosphatase (APISO): 81 U/L (ref 33–115)
BILIRUBIN TOTAL: 0.4 mg/dL (ref 0.2–1.2)
BUN: 8 mg/dL (ref 7–25)
CO2: 26 mmol/L (ref 20–32)
Calcium: 9 mg/dL (ref 8.6–10.2)
Chloride: 103 mmol/L (ref 98–110)
Creat: 0.5 mg/dL (ref 0.50–1.10)
Globulin: 2.9 g/dL (calc) (ref 1.9–3.7)
Glucose, Bld: 87 mg/dL (ref 65–99)
Potassium: 4.1 mmol/L (ref 3.5–5.3)
SODIUM: 138 mmol/L (ref 135–146)
TOTAL PROTEIN: 6.8 g/dL (ref 6.1–8.1)

## 2017-05-23 LAB — IRON,TIBC AND FERRITIN PANEL
%SAT: 6 % (calc) — ABNORMAL LOW (ref 11–50)
FERRITIN: 4 ng/mL — AB (ref 10–154)
IRON: 23 ug/dL — AB (ref 40–190)
TIBC: 405 mcg/dL (calc) (ref 250–450)

## 2017-05-23 LAB — CBC WITH DIFFERENTIAL/PLATELET
BASOS ABS: 32 {cells}/uL (ref 0–200)
Basophils Relative: 0.5 %
EOS ABS: 50 {cells}/uL (ref 15–500)
EOS PCT: 0.8 %
HEMATOCRIT: 30.3 % — AB (ref 35.0–45.0)
HEMOGLOBIN: 9.5 g/dL — AB (ref 11.7–15.5)
LYMPHS ABS: 1909 {cells}/uL (ref 850–3900)
MCH: 24.7 pg — AB (ref 27.0–33.0)
MCHC: 31.4 g/dL — AB (ref 32.0–36.0)
MCV: 78.9 fL — ABNORMAL LOW (ref 80.0–100.0)
MPV: 11.4 fL (ref 7.5–12.5)
Monocytes Relative: 6.1 %
NEUTROS ABS: 3925 {cells}/uL (ref 1500–7800)
Neutrophils Relative %: 62.3 %
Platelets: 336 10*3/uL (ref 140–400)
RBC: 3.84 10*6/uL (ref 3.80–5.10)
RDW: 13.3 % (ref 11.0–15.0)
Total Lymphocyte: 30.3 %
WBC mixed population: 384 cells/uL (ref 200–950)
WBC: 6.3 10*3/uL (ref 3.8–10.8)

## 2017-05-23 LAB — TSH: TSH: 1.68 m[IU]/L

## 2017-05-23 LAB — HEMOGLOBIN A1C
HEMOGLOBIN A1C: 5.2 %{Hb} (ref <5.7)
MEAN PLASMA GLUCOSE: 103 (calc)
eAG (mmol/L): 5.7 (calc)

## 2017-05-23 LAB — VITAMIN B12: Vitamin B-12: 516 pg/mL (ref 200–1100)

## 2017-05-24 ENCOUNTER — Encounter: Payer: Self-pay | Admitting: Family Medicine

## 2017-07-25 ENCOUNTER — Ambulatory Visit: Payer: BC Managed Care – PPO | Admitting: Family Medicine

## 2017-09-28 ENCOUNTER — Encounter (HOSPITAL_COMMUNITY): Payer: Self-pay

## 2017-10-04 ENCOUNTER — Ambulatory Visit: Payer: BC Managed Care – PPO | Admitting: Family Medicine

## 2017-10-09 ENCOUNTER — Ambulatory Visit: Payer: BC Managed Care – PPO | Admitting: Family Medicine

## 2017-10-11 ENCOUNTER — Encounter: Payer: Self-pay | Admitting: Family Medicine

## 2017-10-11 ENCOUNTER — Other Ambulatory Visit: Payer: Self-pay

## 2017-10-11 ENCOUNTER — Ambulatory Visit: Payer: BC Managed Care – PPO | Admitting: Family Medicine

## 2017-10-11 VITALS — BP 102/60 | HR 68 | Temp 98.8°F | Resp 14 | Ht 67.0 in | Wt 279.0 lb

## 2017-10-11 DIAGNOSIS — F330 Major depressive disorder, recurrent, mild: Secondary | ICD-10-CM

## 2017-10-11 DIAGNOSIS — F419 Anxiety disorder, unspecified: Secondary | ICD-10-CM

## 2017-10-11 MED ORDER — VENLAFAXINE HCL 75 MG PO TABS: 75.0000 mg | ORAL_TABLET | Freq: Two times a day (BID) | ORAL | 6 refills | Status: DC

## 2017-10-11 MED ORDER — HYDROXYZINE HCL 25 MG PO TABS: 25.0000 mg | ORAL_TABLET | Freq: Three times a day (TID) | ORAL | 2 refills | Status: DC | PRN

## 2017-10-11 NOTE — Patient Instructions (Addendum)
[place] [facility] [place] [phone] Try the atarax before bedtime  Restart the effexor  F/U 3 months Physical

## 2017-10-11 NOTE — Assessment & Plan Note (Signed)
Anxiety and depression which he is having difficulty thinking back to when all of this started.  I think that she has had some trauma in her childhood or difficulty somewhere with relationships that has led to this.  I think she would significantly benefit from psychotherapy to help her address these things.  I have given her the phone number for restoration placed counseling.  Restart her Effexor as she did feel her mood was better controlled.  Mom can give her hydroxyzine to use before bed for sleep and she can also use as needed if she does feel panicky.  Discussed that this may make her sleepy.

## 2017-10-11 NOTE — Progress Notes (Signed)
   Subjective:    Patient ID: Alexandra Newton, female    DOB: 03/19/1991, 27 y.o.   MRN: [id number]  Patient presents for Follow-up (anxiety medication)  Pt here to f/u anxiety , was on effexor  75mg  BID has been out of medication for the past week.  It helps the overall anxiety but still feels jittery a ttimes, at night feels like she is having panic attacks and cant breath when she lays down. Has to sit up and then doesn't sleep well and is tired the next day Described episode where she left work and burst out in tears for no reason She did not follow up with psychiatry referral after last visit, but willing to do so She finds herself on edge and really doesn't know why   Review Of Systems:  GEN- denies fatigue, fever, weight loss,weakness, recent illness HEENT- denies eye drainage, change in vision, nasal discharge, CVS- denies chest pain, palpitations RESP- denies SOB, cough, wheeze ABD- denies N/V, change in stools, abd pain GU- denies dysuria, hematuria, dribbling, incontinence MSK- denies joint pain, muscle aches, injury Neuro- denies headache, dizziness, syncope, seizure activity       Objective:    BP 102/60   Pulse 68   Temp 98.8 F (37.1 C) (Oral)   Resp 14   Ht 5\' 7"  (1.702 m)   Wt 279 lb (126.6 kg)   SpO2 100%   BMI 43.70 kg/m  GEN- NAD, alert and oriented x3 Psych- normal affect and mood , no SI       Assessment & Plan:      Problem List Items Addressed This Visit      Unprioritized   Anxiety   Relevant Medications   venlafaxine (EFFEXOR) 75 MG tablet   hydrOXYzine (ATARAX/VISTARIL) 25 MG tablet   Depression - Primary    Anxiety and depression which he is having difficulty thinking back to when all of this started.  I think that she has had some trauma in her childhood or difficulty somewhere with relationships that has led to this.  I think she would significantly benefit from psychotherapy to help her address these things.  I have given her the phone  number for restoration placed counseling.  Restart her Effexor as she did feel her mood was better controlled.  Mom can give her hydroxyzine to use before bed for sleep and she can also use as needed if she does feel panicky.  Discussed that this may make her sleepy.      Relevant Medications   venlafaxine (EFFEXOR) 75 MG tablet   hydrOXYzine (ATARAX/VISTARIL) 25 MG tablet      Note: This dictation was prepared with Dragon dictation along with smaller phrase technology. Any transcriptional errors that result from this process are unintentional.

## 2018-01-09 ENCOUNTER — Encounter: Payer: Self-pay | Admitting: Family Medicine

## 2018-01-09 ENCOUNTER — Ambulatory Visit: Payer: BC Managed Care – PPO | Admitting: Family Medicine

## 2018-01-09 ENCOUNTER — Other Ambulatory Visit: Payer: Self-pay

## 2018-01-09 VITALS — BP 128/62 | HR 72 | Temp 97.9°F | Resp 14 | Ht 67.0 in | Wt 280.0 lb

## 2018-01-09 DIAGNOSIS — Z124 Encounter for screening for malignant neoplasm of cervix: Secondary | ICD-10-CM | POA: Diagnosis not present

## 2018-01-09 DIAGNOSIS — F419 Anxiety disorder, unspecified: Secondary | ICD-10-CM | POA: Diagnosis not present

## 2018-01-09 DIAGNOSIS — F330 Major depressive disorder, recurrent, mild: Secondary | ICD-10-CM

## 2018-01-09 DIAGNOSIS — Z0000 Encounter for general adult medical examination without abnormal findings: Secondary | ICD-10-CM

## 2018-01-09 MED ORDER — HYDROXYZINE HCL 25 MG PO TABS: 25.0000 mg | ORAL_TABLET | Freq: Three times a day (TID) | ORAL | 2 refills | Status: DC | PRN

## 2018-01-09 MED ORDER — VENLAFAXINE HCL 75 MG PO TABS: 75.0000 mg | ORAL_TABLET | Freq: Two times a day (BID) | ORAL | 6 refills | Status: DC

## 2018-01-09 MED ORDER — BUSPIRONE HCL 7.5 MG PO TABS: 7.5000 mg | ORAL_TABLET | Freq: Two times a day (BID) | ORAL | 3 refills | Status: DC

## 2018-01-09 NOTE — Assessment & Plan Note (Signed)
Continue effexor, add buspar 7.5mg  BID Prn atarax at bedtime Recheck in 2 months

## 2018-01-09 NOTE — Progress Notes (Signed)
   Subjective:    Patient ID: Alexandra Newton, female    DOB: Apr 18, 1991, 27 y.o.   MRN: [id number]  Patient presents for Follow-up (is fasting)  Pt here for CPE  Due for PAP Smear  Immunizations UTD  History and medications reviewed Getting counseling at [facility] in [place]  Obesity- states weight went up to 290lbs , now exerising again 2-3 days a week, now drinking protein shakes for breakfast and veggies/grilled proteins   Depression/anxiety- continues to feel anxious during the day, but can not take atarax as it makes her sleepy. She does sleep much better with the medicine  Feels affects of effexor  Getting therapy from her pastor's wife who is a therapist   Review Of Systems:  GEN- denies fatigue, fever, weight loss,weakness, recent illness HEENT- denies eye drainage, change in vision, nasal discharge, CVS- denies chest pain, palpitations RESP- denies SOB, cough, wheeze ABD- denies N/V, change in stools, abd pain GU- denies dysuria, hematuria, dribbling, incontinence MSK- denies joint pain, muscle aches, injury Neuro- denies headache, dizziness, syncope, seizure activity       Objective:    BP 128/62   Pulse 72   Temp 97.9 F (36.6 C) (Oral)   Resp 14   Ht  (1.702 m)   Wt 280 lb (127 kg)   SpO2 98%   BMI 43.85 kg/m  GEN- NAD, alert and oriented x3 HEENT- PERRL, EOMI, non injected sclera, pink conjunctiva, MMM, oropharynx clear Neck- Supple, no thyromegaly Breast- normal symmetry, no nipple inversion,no nipple drainage, no nodules or lumps felt Nodes- no axillary nodes CVS- RRR, no murmur RESP-CTAB ABD-NABS,soft,NT,ND GU- normal external genitalia, vaginal mucosa pink and moist, cervix visualized no growth, no blood form os, No discharge, no CMT, no ovarian masses, uterus normal size Psych- normal affect and mood EXT- No edema Pulses- Radial, DP- 2+        Assessment & Plan:      Problem List Items Addressed This Visit      Unprioritized   Morbid obesity (HCC)   Anxiety   Relevant Medications   busPIRone (BUSPAR) 7.5 MG tablet   hydrOXYzine (ATARAX/VISTARIL) 25 MG tablet   venlafaxine (EFFEXOR) 75 MG tablet   Depression    Continue effexor, add buspar 7.5mg  BID Prn atarax at bedtime Recheck in 2 months       Relevant Medications   busPIRone (BUSPAR) 7.5 MG tablet   hydrOXYzine (ATARAX/VISTARIL) 25 MG tablet   venlafaxine (EFFEXOR) 75 MG tablet    Other Visit Diagnoses    Routine general medical examination at a health care facility    -  Primary   CPE done, menses regular, PAP Smear done, fasting labs   Relevant Orders   CBC with Differential/Platelet   Comprehensive metabolic panel   Lipid panel   HIV antibody   Cervical cancer screening       Relevant Orders   Pap IG w/ reflex to HPV when ASC-U      Note: This dictation was prepared with Dragon dictation along with smaller phrase technology. Any transcriptional errors that result from this process are unintentional.

## 2018-01-09 NOTE — Patient Instructions (Addendum)
F/U 2 months for medications We will call with lab results  Start buspar for panic/anxiety feelings

## 2018-01-10 LAB — LIPID PANEL
Cholesterol: 192 mg/dL (ref <200)
HDL: 60 mg/dL (ref >50)
LDL CHOLESTEROL (CALC): 114 mg/dL — AB
NON-HDL CHOLESTEROL (CALC): 132 mg/dL — AB (ref <130)
TRIGLYCERIDES: 82 mg/dL (ref <150)
Total CHOL/HDL Ratio: 3.2 (calc) (ref <5.0)

## 2018-01-10 LAB — CBC WITH DIFFERENTIAL/PLATELET
Basophils Absolute: 41 cells/uL (ref 0–200)
Basophils Relative: 0.5 %
EOS PCT: 0.4 %
Eosinophils Absolute: 32 cells/uL (ref 15–500)
HEMATOCRIT: 30.1 % — AB (ref 35.0–45.0)
HEMOGLOBIN: 9.2 g/dL — AB (ref 11.7–15.5)
LYMPHS ABS: 2292 {cells}/uL (ref 850–3900)
MCH: 23 pg — ABNORMAL LOW (ref 27.0–33.0)
MCHC: 30.6 g/dL — AB (ref 32.0–36.0)
MCV: 75.3 fL — ABNORMAL LOW (ref 80.0–100.0)
MONOS PCT: 7 %
MPV: 10.4 fL (ref 7.5–12.5)
NEUTROS ABS: 5168 {cells}/uL (ref 1500–7800)
NEUTROS PCT: 63.8 %
Platelets: 384 10*3/uL (ref 140–400)
RBC: 4 10*6/uL (ref 3.80–5.10)
RDW: 14.5 % (ref 11.0–15.0)
Total Lymphocyte: 28.3 %
WBC mixed population: 567 cells/uL (ref 200–950)
WBC: 8.1 10*3/uL (ref 3.8–10.8)

## 2018-01-10 LAB — PAP IG W/ RFLX HPV ASCU

## 2018-01-10 LAB — COMPREHENSIVE METABOLIC PANEL
AG Ratio: 1.6 (calc) (ref 1.0–2.5)
ALBUMIN MSPROF: 4.5 g/dL (ref 3.6–5.1)
ALT: 13 U/L (ref 6–29)
AST: 12 U/L (ref 10–30)
Alkaline phosphatase (APISO): 79 U/L (ref 33–115)
BILIRUBIN TOTAL: 0.5 mg/dL (ref 0.2–1.2)
BUN / CREAT RATIO: 27 (calc) — AB (ref 6–22)
BUN: 13 mg/dL (ref 7–25)
CALCIUM: 9.5 mg/dL (ref 8.6–10.2)
CO2: 27 mmol/L (ref 20–32)
CREATININE: 0.48 mg/dL — AB (ref 0.50–1.10)
Chloride: 103 mmol/L (ref 98–110)
Globulin: 2.8 g/dL (calc) (ref 1.9–3.7)
Glucose, Bld: 88 mg/dL (ref 65–99)
POTASSIUM: 4.2 mmol/L (ref 3.5–5.3)
SODIUM: 139 mmol/L (ref 135–146)
TOTAL PROTEIN: 7.3 g/dL (ref 6.1–8.1)

## 2018-01-10 LAB — HIV ANTIBODY (ROUTINE TESTING W REFLEX): HIV: NONREACTIVE

## 2018-01-19 ENCOUNTER — Encounter: Payer: Self-pay | Admitting: *Deleted

## 2018-03-12 ENCOUNTER — Ambulatory Visit: Payer: BC Managed Care – PPO | Admitting: Family Medicine

## 2018-03-14 ENCOUNTER — Encounter: Payer: Self-pay | Admitting: Family Medicine

## 2020-07-25 ENCOUNTER — Encounter (HOSPITAL_COMMUNITY): Payer: Self-pay | Admitting: *Deleted

## 2020-07-25 ENCOUNTER — Emergency Department (HOSPITAL_COMMUNITY)
Admission: EM | Admit: 2020-07-25 | Discharge: 2020-07-25 | Disposition: A | Discharge status: Home / Self Care | Payer: BC Managed Care – PPO | Attending: Emergency Medicine | Admitting: Emergency Medicine

## 2020-07-25 ENCOUNTER — Other Ambulatory Visit: Payer: Self-pay

## 2020-07-25 DIAGNOSIS — M5481 Occipital neuralgia: Secondary | ICD-10-CM | POA: Insufficient documentation

## 2020-07-25 DIAGNOSIS — R519 Headache, unspecified: Secondary | ICD-10-CM

## 2020-07-25 DIAGNOSIS — Z79899 Other long term (current) drug therapy: Secondary | ICD-10-CM | POA: Insufficient documentation

## 2020-07-25 MED ORDER — GABAPENTIN 100 MG PO CAPS: 100.0000 mg | ORAL_CAPSULE | Freq: Three times a day (TID) | ORAL | 0 refills | Status: DC | PRN

## 2020-07-25 NOTE — Discharge Instructions (Signed)
You have symptoms suggestive of ice pick headaches.  This is likely a benign condition.  If it persists, you may take Neurontin as prescribed.  Follow-up with your doctor for further care.

## 2020-07-25 NOTE — ED Triage Notes (Signed)
Pt with stabbing pains to back of head off and on for months.  Yesterday has been worse, several HA's yesterday.  Nausea at times.  Tried tylenol, advil and aleve for it, at times helps.

## 2020-07-25 NOTE — ED Provider Notes (Signed)
[facility] EMERGENCY DEPARTMENT Provider Note   CSN: [id number] Arrival date & time: 07/25/20  1546     History Chief Complaint  Patient presents with  . Headache    Alexandra Newton is a 29 y.o. female.  The history is provided by the patient. No language interpreter was used.  Headache    29 year old female significant history of recurrent headache, obesity, anxiety, depression, presenting complaint of headache.  Patient report for the past 2 days she has had recurrent headache.  She described headache as a sharp stabbing sensation lasting for seconds that would happen sporadically usually because it attempt up her neck.  She would experiencing 6-7 episodes per day for the past 2 to 3 days.  At this time she denies any active headache.  She denies any fever, diplopia, loss of vision, light or sound sensitivity, focal numbness or weakness, neck stiffness, or rash.  She report of bed is uncomfortable but otherwise no new changes.  She denies any runny nose sneezing or coughing with these episode.  She occasionally will take ibuprofen but states that these episodes are very short lasting.  Past Medical History:  Diagnosis Date  . Allergy    seasonal  . Anxiety   . Borderline anemia   . Depression   . History of bronchitis as a child   . Panic attacks     Patient Active Problem List   Diagnosis Date Noted  . Morbid obesity (HCC) 12/02/2015  . Allergy   . Anxiety   . Depression     Past Surgical History:  Procedure Laterality Date  . LAPAROSCOPIC GASTRIC SLEEVE RESECTION N/A 02/22/2016   Procedure: LAPAROSCOPIC GASTRIC SLEEVE RESECTION WITH UPPER ENDO;  Surgeon: Glenna Fellows, MD;  Location: [facility] ORS;  Service: General;  Laterality: N/A;  . WISDOM TOOTH EXTRACTION     4     OB History   No obstetric history on file.     Family History  Problem Relation Age of Onset  . Arthritis Mother   . Cancer Mother        ovarian  . Depression Mother   . Diabetes Mother   .  Hypertension Mother   . Obesity Mother   . Alcohol abuse Father   . Arthritis Father   . Depression Father   . Arthritis Sister   . Depression Sister   . Early death Maternal Grandmother   . Obesity Maternal Grandmother   . Cancer Maternal Grandmother        Lung   . Cancer Maternal Grandfather        prostate  . Diabetes Maternal Grandfather   . Hypertension Maternal Grandfather   . Arthritis Paternal Grandmother   . Depression Paternal Grandmother   . Diabetes Paternal Grandmother   . Hypertension Paternal Grandmother   . Hyperlipidemia Paternal Grandfather   . Stroke Paternal Grandfather   . Cancer Maternal Aunt        breast  . Depression Maternal Aunt     Social History   Tobacco Use  . Smoking status: Never Smoker  . Smokeless tobacco: Never Used  Substance Use Topics  . Alcohol use: Yes    Alcohol/week: 0.0 standard drinks    Comment: rarely  . Drug use: No    Home Medications Prior to Admission medications   Medication Sig Start Date End Date Taking? Authorizing Provider  busPIRone (BUSPAR) 7.5 MG tablet Take 1 tablet (7.5 mg total) by mouth 2 (two) times daily. For anxiety  01/09/18   Salley Scarlet, MD  hydrOXYzine (ATARAX/VISTARIL) 25 MG tablet Take 1 tablet (25 mg total) by mouth 3 (three) times daily as needed. 01/09/18   Salley Scarlet, MD  venlafaxine [facility]) 75 MG tablet Take 1 tablet (75 mg total) by mouth 2 (two) times daily with a meal. 01/09/18   [place], Velna Hatchet, MD    Allergies    Patient has no known allergies.  Review of Systems   Review of Systems  Neurological: Positive for headaches.  All other systems reviewed and are negative.   Physical Exam Updated Vital Signs BP 108/70 (BP Location: Right Arm)   Pulse 81   Temp 98.5 F (36.9 C) (Oral)   Resp 16   Ht 5\' 7"  (1.702 m)   Wt (!) 149.7 kg   LMP  (LMP Unknown) Comment: irregular  SpO2 97%   BMI 51.69 kg/m   Physical Exam Vitals and nursing note reviewed.   Constitutional:      General: She is not in acute distress.    Appearance: She is well-developed and well-nourished. She is obese.  HENT:     Head: Normocephalic and atraumatic.  Eyes:     Extraocular Movements: Extraocular movements intact.     Conjunctiva/sclera: Conjunctivae normal.     Pupils: Pupils are equal, round, and reactive to light.  Musculoskeletal:     Cervical back: Normal range of motion and neck supple. No rigidity.     Comments: 5 out of 5 strength all 4 extremities  Skin:    Findings: No rash.  Neurological:     Mental Status: She is alert and oriented to person, place, and time.     GCS: GCS eye subscore is 4. GCS verbal subscore is 5. GCS motor subscore is 6.     Cranial Nerves: No cranial nerve deficit.     Sensory: No sensory deficit.  Psychiatric:        Mood and Affect: Mood and affect and mood normal.     ED Results / Procedures / Treatments   Labs (all labs ordered are listed, but only abnormal results are displayed) Labs Reviewed - No data to display  EKG None  Radiology No results found.  Procedures Procedures (including critical care time)  Medications Ordered in ED Medications - No data to display  ED Course  I have reviewed the triage vital signs and the nursing notes.  Pertinent labs & imaging results that were available during my care of the patient were reviewed by me and considered in my medical decision making (see chart for details).    MDM Rules/Calculators/A&P                          BP 108/70 (BP Location: Right Arm)   Pulse 81   Temp 98.5 F (36.9 C) (Oral)   Resp 16   Ht 5\' 7"  (1.702 m)   Wt (!) 149.7 kg   LMP  (LMP Unknown) Comment: irregular  SpO2 97%   BMI 51.69 kg/m   Final Clinical Impression(s) / ED Diagnoses Final diagnoses:  Recurrent occipital headache    Rx / DC Orders ED Discharge Orders         Ordered    gabapentin (NEURONTIN) 100 MG capsule  3 times daily PRN        07/25/20 2115          9:12 PM Patient here with symptoms suggestive of ice pick  headache.  She is well-appearing.  No red flags.  No acute onset thunderclap headache concerning for subarachnoid hemorrhage, no fever or nuchal rigidity concerning for meningitis, no focal neuro deficits concerning for stroke or space-occupying lesion.  Will prescribe a short course of Neurontin to use as needed.  Encourage patient to follow-up with PCP or with neurology if symptoms persist.  Return precaution discussed.  Doubt MS.   Fayrene Helper, PA-C 07/25/20 2117    Eber Hong, MD 07/26/20 1444

## 2020-09-23 ENCOUNTER — Ambulatory Visit (INDEPENDENT_AMBULATORY_CARE_PROVIDER_SITE_OTHER): Payer: BC Managed Care – PPO | Admitting: Family Medicine

## 2020-09-23 ENCOUNTER — Other Ambulatory Visit: Payer: Self-pay

## 2020-09-23 ENCOUNTER — Encounter: Payer: Self-pay | Admitting: Family Medicine

## 2020-09-23 VITALS — BP 116/68 | HR 90 | Temp 98.1°F | Resp 16 | Ht 67.0 in | Wt 348.0 lb

## 2020-09-23 DIAGNOSIS — F419 Anxiety disorder, unspecified: Secondary | ICD-10-CM

## 2020-09-23 DIAGNOSIS — F330 Major depressive disorder, recurrent, mild: Secondary | ICD-10-CM

## 2020-09-23 MED ORDER — BUSPIRONE HCL 7.5 MG PO TABS: 7.5000 mg | ORAL_TABLET | Freq: Two times a day (BID) | ORAL | 3 refills | Status: AC

## 2020-09-23 MED ORDER — FLUOXETINE HCL 20 MG PO TABS: 20.0000 mg | ORAL_TABLET | Freq: Every day | ORAL | 3 refills | Status: DC

## 2020-09-23 NOTE — Assessment & Plan Note (Signed)
Patient with history of major depression along with generalized anxiety.  She has a family history of depression and mental health problems as well.  After further discussion we will start her on fluoxetine 20 mg once a day.  This can help with both anxiety and depression symptoms and should have less effect on the weight compared to other medications.  As she is getting benefit with regards to her anxiety with BuSpar recommend she take this twice a day every day.  We will follow-up via telehealth in a few weeks.  She will continue with her psychotherapy which is already established with her church.  She is to call me for any side effects with the medications.  Of note she does have gabapentin which she was given by the emergency room to take as needed for headaches.  But she does admit that her headaches come from stress as well as will begin targeting depression anxiety but she also improved along with her sleep.

## 2020-09-23 NOTE — Patient Instructions (Addendum)
Start Prozac 20mg  in the morning Take the buspar twice a day  F/U Telehealth Virtual 3 weeks

## 2020-09-23 NOTE — Progress Notes (Signed)
Subjective:    Patient ID: Alexandra Newton, female    DOB: 16-Sep-1990, 30 y.o.   MRN: [id number]  Patient presents for Medication Review/ Refill (States that she has been taking anxiety/ depression medication intermittently, but does not feel this is the correct fit for her/)  Pt here to f/u anxiety and depression. Her last visit was in May 2019. She was last on buspar 7.5mg  BID and effexor 75mg  BID  She also had atarax prn anxiety attacks  Effexor  She took for 2 months straight didn't help and she felt weird on the med she discontinued it.  Of the past 6 months more steady increase problems with her sleep and anxiety stress.  She states that she has think she should be happy about including a promotion at her job she is staying with a church group and a travel as she still feels depressed and anxious.  She has tried taking the hydroxyzine however because of her work schedule she has to get about 4-5 it makes her too groggy to take this.  She does see improvement when she takes BuSpar but was trying to spread it out but the past few weeks has been taking daily and it helps for most of the day but then Monday afternoon her anxiety is high.  She does not have anything at this moment to help with the depression symptoms.  She has had some fleeting passive suicidal ideations but has never had a plan and has no intent to hurt herself.  She is counseling through her church a few times a month which she does find beneficial.  Also has some family stress that she is helping to take care of her father whom she lives with who is in poor health condition.  She admits that she has avoided coming to the doctor as she does not like to come to the doctor discuss her problems. Review Of Systems:  GEN- denies fatigue, fever, weight loss,weakness, recent illness HEENT- denies eye drainage, change in vision, nasal discharge, CVS- denies chest pain, palpitations RESP- denies SOB, cough, wheeze ABD- denies N/V, change in  stools, abd pain Neuro- denies headache, dizziness, syncope, seizure activity       Objective:    BP 116/68   Pulse 90   Temp 98.1 F (36.7 C) (Temporal)   Resp 16   Ht 5\' 7"  (1.702 m)   Wt (!) 348 lb (157.9 kg)   LMP 09/03/2020 Comment: regular  SpO2 97%   BMI 54.50 kg/m  GEN- NAD, alert and oriented x3, obese HEENT- PERRL, EOMI, non injected sclera, pink conjunctiva, MMM, oropharynx clear Neck- Supple, no thyromegaly CVS- RRR, no murmur RESP-CTAB Psych depressed affect not overly anxious no apparent suicidal ideations good eye contact EXT- No edema Pulses- Radial, 2+  GAD 7 score 18  PHQ   20  , history of passive SI       Assessment & Plan:      Problem List Items Addressed This Visit      Unprioritized   Anxiety   Relevant Medications   busPIRone (BUSPAR) 7.5 MG tablet   FLUoxetine (PROZAC) 20 MG tablet   Depression - Primary    Patient with history of major depression along with generalized anxiety.  She has a family history of depression and mental health problems as well.  After further discussion we will start her on fluoxetine 20 mg once a day.  This can help with both anxiety and depression symptoms and should  have less effect on the weight compared to other medications.  As she is getting benefit with regards to her anxiety with BuSpar recommend she take this twice a day every day.  We will follow-up via telehealth in a few weeks.  She will continue with her psychotherapy which is already established with her church.  She is to call me for any side effects with the medications.  Of note she does have gabapentin which she was given by the emergency room to take as needed for headaches.  But she does admit that her headaches come from stress as well as will begin targeting depression anxiety but she also improved along with her sleep.      Relevant Medications   busPIRone (BUSPAR) 7.5 MG tablet   FLUoxetine (PROZAC) 20 MG tablet      Note: This dictation  was prepared with Dragon dictation along with smaller phrase technology. Any transcriptional errors that result from this process are unintentional.

## 2020-10-14 ENCOUNTER — Other Ambulatory Visit: Payer: Self-pay

## 2020-10-14 ENCOUNTER — Telehealth (INDEPENDENT_AMBULATORY_CARE_PROVIDER_SITE_OTHER): Payer: BC Managed Care – PPO | Admitting: Family Medicine

## 2020-10-14 DIAGNOSIS — Z5329 Procedure and treatment not carried out because of patient's decision for other reasons: Secondary | ICD-10-CM

## 2020-10-14 NOTE — Progress Notes (Signed)
Called no way to get vitals. School nurse not on campus today.

## 2020-10-14 NOTE — Progress Notes (Signed)
Virtual Visit via Telephone Note  I connected with Alexandra Newton on 10/14/20 at 11:08am  by telephone and verified that I am speaking with the correct person using two identifiers.  LVM on cell phone, regarding appointment 11:08am ,   Unable to leave message  on house number regarding appointment  Location: Patient: In office Provider:    I discussed the limitations, risks, security and privacy concerns of performing an evaluation and management service by telephone and the availability of in person appointments. I also discussed with the patient that there may be a patient responsible charge related to this service. The patient expressed understanding and agreed to proceed.   History of Present Illness:  Patient with history of major depression along with generalized anxiety.  She has a family history of depression and mental health problems as well. At her visit 3 weeks ago plan was     After further discussion we will start her on fluoxetine 20 mg once a day.  This can help with both anxiety and depression symptoms and should have less effect on the weight compared to other medications.  As she is getting benefit with regards to her anxiety with BuSpar recommend she take this twice a day every day.  We will follow-up via telehealth in a few weeks.  She will continue with her psychotherapy which is already established with her church.  She is to call me for any side effects with the medications.  Of note she does have gabapentin which she was given by the emergency room to take as needed for headaches.  But she does admit that her headaches come from stress as well as will begin targeting depression anxiety but she also improved along with her sleep.       Observations/Objective: Unable to visualize Normal speech on phone, no appa  Assessment and Plan: MDD- mild with GAD-     Follow Up Instructions:    I discussed the assessment and treatment plan with the patient. The patient was  provided an opportunity to ask questions and all were answered. The patient agreed with the plan and demonstrated an understanding of the instructions.   The patient was advised to call back or seek an in-person evaluation if the symptoms worsen or if the condition fails to improve as anticipated.  End Time:  No show   Milinda Antis, MD

## 2022-07-17 ENCOUNTER — Telehealth: Payer: BC Managed Care – PPO | Admitting: Family

## 2022-07-17 DIAGNOSIS — J029 Acute pharyngitis, unspecified: Secondary | ICD-10-CM

## 2022-07-17 MED ORDER — AMOXICILLIN 400 MG/5ML PO SUSR: 500.0000 mg | Freq: Two times a day (BID) | ORAL | 0 refills | Status: AC

## 2022-07-17 NOTE — Patient Instructions (Signed)

## 2022-07-17 NOTE — Progress Notes (Signed)
Virtual Visit Consent   Alexandra Newton, you are scheduled for a virtual visit with a [facility] provider today. Just as with appointments in the office, your consent must be obtained to participate. Your consent will be active for this visit and any virtual visit you may have with one of our providers in the next 365 days. If you have a MyChart account, a copy of this consent can be sent to you electronically.  As this is a virtual visit, video technology does not allow for your provider to perform a traditional examination. This may limit your provider's ability to fully assess your condition. If your provider identifies any concerns that need to be evaluated in person or the need to arrange testing (such as labs, EKG, etc.), we will make arrangements to do so. Although advances in technology are sophisticated, we cannot ensure that it will always work on either your end or our end. If the connection with a video visit is poor, the visit may have to be switched to a telephone visit. With either a video or telephone visit, we are not always able to ensure that we have a secure connection.  By engaging in this virtual visit, you consent to the provision of healthcare and authorize for your insurance to be billed (if applicable) for the services provided during this visit. Depending on your insurance coverage, you may receive a charge related to this service.  I need to obtain your verbal consent now. Are you willing to proceed with your visit today? Alexandra Newton has provided verbal consent on 07/17/2022 for a virtual visit (video or telephone). Alexandra Rodney, FNP  Date: 07/17/2022 9:14 AM  Virtual Visit via Video Note   I, Alexandra Newton, connected with  Alexandra Newton  ([id number], Alexandra Newton, 1992) on 07/17/22 at  9:30 AM EST by a video-enabled telemedicine application and verified that I am speaking with the correct person using two identifiers.  Location: Patient: Virtual Visit Location Patient: Home Provider:  Virtual Visit Location Provider: Home Office   I discussed the limitations of evaluation and management by telemedicine and the availability of in person appointments. The patient expressed understanding and agreed to proceed.    History of Present Illness: Alexandra Newton is a 31 y.o. who identifies as a female who was assigned female at birth, and is being seen today for sore throat and swollen tonsils.  HPI: Sore Throat  This is a new problem. The current episode started yesterday. The problem has been gradually worsening. Maximum temperature: low grade. The pain is at a severity of 5/10. The pain is mild. Associated symptoms include swollen glands and trouble swallowing. Pertinent negatives include no congestion, coughing, ear discharge, ear pain or headaches. She has tried acetaminophen for the symptoms. The treatment provided mild relief.    Problems:  Patient Active Problem List   Diagnosis Date Noted   Morbid obesity (HCC) 12/02/2015   Allergy    Anxiety    Depression     Allergies: No Known Allergies Medications:  Current Outpatient Medications:    amoxicillin (AMOXIL) 400 MG/5ML suspension, Take 6.3 mLs (500 mg total) by mouth 2 (two) times daily for 10 days., Disp: 126 mL, Rfl: 0   busPIRone (BUSPAR) 7.5 MG tablet, Take 1 tablet (7.5 mg total) by mouth 2 (two) times daily. For anxiety, Disp: 60 tablet, Rfl: 3   FLUoxetine (PROZAC) 20 MG tablet, Take 1 tablet (20 mg total) by mouth daily., Disp: 30 tablet, Rfl: 3   gabapentin (NEURONTIN) 100  MG capsule, Take 1 capsule (100 mg total) by mouth 3 (three) times daily as needed (headache)., Disp: 30 capsule, Rfl: 0  Observations/Objective: Patient is well-developed, well-nourished in no acute distress.  Resting comfortably  at home.  Head is normocephalic, atraumatic.  No labored breathing.  Speech is clear and coherent with logical content.  Patient is alert and oriented at baseline.  Enlarged tonsils 3+  Assessment and Plan: 1.  Acute pharyngitis, unspecified etiology - amoxicillin (AMOXIL) 400 MG/5ML suspension; Take 6.3 mLs (500 mg total) by mouth 2 (two) times daily for 10 days.  Dispense: 126 mL; Refill: 0  - Take meds as prescribed - Use a cool mist humidifier  -Use saline nose sprays frequently -Force fluids -For any cough or congestion  Use plain Mucinex- regular strength or max strength is fine -For fever or aces or pains- take tylenol or ibuprofen. -Throat lozenges if help -New toothbrush in 3 days    Follow Up Instructions: I discussed the assessment and treatment plan with the patient. The patient was provided an opportunity to ask questions and all were answered. The patient agreed with the plan and demonstrated an understanding of the instructions.  A copy of instructions were sent to the patient via MyChart unless otherwise noted below.     The patient was advised to call back or seek an in-person evaluation if the symptoms worsen or if the condition fails to improve as anticipated.  Time:  I spent 6 minutes with the patient via telehealth technology discussing the above problems/concerns.    Evelina Dun, FNP

## 2023-09-20 ENCOUNTER — Other Ambulatory Visit: Payer: Self-pay

## 2023-09-20 ENCOUNTER — Emergency Department (HOSPITAL_COMMUNITY): Payer: 59

## 2023-09-20 ENCOUNTER — Emergency Department (HOSPITAL_COMMUNITY)
Admission: EM | Admit: 2023-09-20 | Discharge: 2023-09-20 | Disposition: A | Discharge status: Home / Self Care | Payer: 59 | Attending: Emergency Medicine | Admitting: Emergency Medicine

## 2023-09-20 ENCOUNTER — Encounter (HOSPITAL_COMMUNITY): Payer: Self-pay | Admitting: *Deleted

## 2023-09-20 DIAGNOSIS — R109 Unspecified abdominal pain: Secondary | ICD-10-CM | POA: Diagnosis present

## 2023-09-20 DIAGNOSIS — R0789 Other chest pain: Secondary | ICD-10-CM | POA: Diagnosis not present

## 2023-09-20 DIAGNOSIS — R0602 Shortness of breath: Secondary | ICD-10-CM | POA: Insufficient documentation

## 2023-09-20 LAB — CBC WITH DIFFERENTIAL/PLATELET
Abs Immature Granulocytes: 0.02 10*3/uL (ref 0.00–0.07)
Basophils Absolute: 0 10*3/uL (ref 0.0–0.1)
Basophils Relative: 0 %
Eosinophils Absolute: 0.1 10*3/uL (ref 0.0–0.5)
Eosinophils Relative: 1 %
HCT: 34.3 % — ABNORMAL LOW (ref 36.0–46.0)
Hemoglobin: 10.7 g/dL — ABNORMAL LOW (ref 12.0–15.0)
Immature Granulocytes: 0 %
Lymphocytes Relative: 17 %
Lymphs Abs: 1.6 10*3/uL (ref 0.7–4.0)
MCH: 25.8 pg — ABNORMAL LOW (ref 26.0–34.0)
MCHC: 31.2 g/dL (ref 30.0–36.0)
MCV: 82.7 fL (ref 80.0–100.0)
Monocytes Absolute: 0.7 10*3/uL (ref 0.1–1.0)
Monocytes Relative: 8 %
Neutro Abs: 7.1 10*3/uL (ref 1.7–7.7)
Neutrophils Relative %: 74 %
Platelets: 311 10*3/uL (ref 150–400)
RBC: 4.15 MIL/uL (ref 3.87–5.11)
RDW: 15 % (ref 11.5–15.5)
WBC: 9.6 10*3/uL (ref 4.0–10.5)
nRBC: 0 % (ref 0.0–0.2)

## 2023-09-20 LAB — COMPREHENSIVE METABOLIC PANEL
ALT: 50 U/L — ABNORMAL HIGH (ref 0–44)
AST: 27 U/L (ref 15–41)
Albumin: 3.6 g/dL (ref 3.5–5.0)
Alkaline Phosphatase: 74 U/L (ref 38–126)
Anion gap: 9 (ref 5–15)
BUN: 9 mg/dL (ref 6–20)
CO2: 23 mmol/L (ref 22–32)
Calcium: 8.6 mg/dL — ABNORMAL LOW (ref 8.9–10.3)
Chloride: 106 mmol/L (ref 98–111)
Creatinine, Ser: 0.48 mg/dL (ref 0.44–1.00)
GFR, Estimated: >60 mL/min (ref >60)
Glucose, Bld: 114 mg/dL — ABNORMAL HIGH (ref 70–99)
Potassium: 3.7 mmol/L (ref 3.5–5.1)
Sodium: 138 mmol/L (ref 135–145)
Total Bilirubin: 0.6 mg/dL (ref 0.0–1.2)
Total Protein: 7.1 g/dL (ref 6.5–8.1)

## 2023-09-20 LAB — TROPONIN I (HIGH SENSITIVITY): Troponin I (High Sensitivity): <2 ng/L (ref <18)

## 2023-09-20 LAB — URINALYSIS, ROUTINE W REFLEX MICROSCOPIC
Bacteria, UA: NONE SEEN
Glucose, UA: NEGATIVE mg/dL
Hgb urine dipstick: NEGATIVE
Ketones, ur: NEGATIVE mg/dL
Leukocytes,Ua: NEGATIVE
Nitrite: NEGATIVE
Protein, ur: 30 mg/dL — AB
Specific Gravity, Urine: 1.032 — ABNORMAL HIGH (ref 1.005–1.030)
pH: 5 (ref 5.0–8.0)

## 2023-09-20 LAB — LIPASE, BLOOD: Lipase: 26 U/L (ref 11–51)

## 2023-09-20 LAB — HCG, SERUM, QUALITATIVE: Preg, Serum: NEGATIVE

## 2023-09-20 MED ORDER — IOHEXOL 350 MG/ML SOLN
100.0000 mL | Freq: Once | INTRAVENOUS | Status: AC | PRN
  Administered 2023-09-20: 100 mL via INTRAVENOUS

## 2023-09-20 MED ORDER — METHOCARBAMOL 500 MG PO TABS
1000.0000 mg | ORAL_TABLET | Freq: Once | ORAL | Status: DC
  Filled 2023-09-20: qty 2

## 2023-09-20 MED ORDER — LIDOCAINE 5 % EX PTCH
1.0000 | MEDICATED_PATCH | CUTANEOUS | Status: DC
Start: 2023-09-20 — End: 2023-09-20
  Administered 2023-09-20: 1 via TRANSDERMAL
  Filled 2023-09-20: qty 1

## 2023-09-20 MED ORDER — KETOROLAC TROMETHAMINE 15 MG/ML IJ SOLN
15.0000 mg | Freq: Once | INTRAMUSCULAR | Status: AC
  Administered 2023-09-20: 15 mg via INTRAVENOUS
  Filled 2023-09-20: qty 1

## 2023-09-20 MED ORDER — METHOCARBAMOL 500 MG PO TABS: 1000.0000 mg | ORAL_TABLET | Freq: Three times a day (TID) | ORAL | 0 refills | Status: DC | PRN

## 2023-09-20 MED ORDER — FENTANYL CITRATE PF 50 MCG/ML IJ SOSY
50.0000 ug | PREFILLED_SYRINGE | Freq: Once | INTRAMUSCULAR | Status: AC
  Administered 2023-09-20: 50 ug via INTRAVENOUS
  Filled 2023-09-20: qty 1

## 2023-09-20 MED ORDER — LACTATED RINGERS IV BOLUS
1000.0000 mL | Freq: Once | INTRAVENOUS | Status: AC
  Administered 2023-09-20: 1000 mL via INTRAVENOUS

## 2023-09-20 MED ORDER — LIDOCAINE 5 % EX PTCH: 1.0000 | MEDICATED_PATCH | CUTANEOUS | 0 refills | Status: DC

## 2023-09-20 NOTE — ED Triage Notes (Signed)
 Pt c/o left flank pain that has gotten worse over the last couple of weeks; pt states it hurts to take a deep breath  Pt is very tearful in triage

## 2023-09-20 NOTE — ED Provider Notes (Signed)
 [facility] EMERGENCY DEPARTMENT AT [facility] Provider Note   CSN: [id number] Arrival date & time: 09/20/23  0015     History  Chief Complaint  Patient presents with   Flank Pain    Alexandra Newton is a 33 y.o. female.   Flank Pain Associated symptoms include shortness of breath.  Patient presents for left flank pain.  Medical history includes anxiety, anemia, depression.  Pain has been present over the past week.  It has been worsening.  Due to pain, it is difficult to take a deep breath.  She has been treating her pain with Aleve with minimal relief.     Home Medications Prior to Admission medications   Medication Sig Start Date End Date Taking? Authorizing Provider  lidocaine  (LIDODERM ) 5 % Place 1 patch onto the skin daily. Remove & Discard patch within 12 hours or as directed by MD 09/20/23  Yes Melvenia Motto, MD  methocarbamol  (ROBAXIN ) 500 MG tablet Take 2 tablets (1,000 mg total) by mouth every 8 (eight) hours as needed. 09/20/23  Yes Melvenia Motto, MD  busPIRone  (BUSPAR ) 7.5 MG tablet Take 1 tablet (7.5 mg total) by mouth 2 (two) times daily. For anxiety 09/23/20   Bari Theodoro FALCON, MD  FLUoxetine  (PROZAC ) 20 MG tablet Take 1 tablet (20 mg total) by mouth daily. 09/23/20   Bari Theodoro FALCON, MD  gabapentin  (NEURONTIN ) 100 MG capsule Take 1 capsule (100 mg total) by mouth 3 (three) times daily as needed (headache). 07/25/20   Nivia Colon, PA-C      Allergies    Patient has no known allergies.    Review of Systems   Review of Systems  Respiratory:  Positive for shortness of breath.   Genitourinary:  Positive for flank pain.  All other systems reviewed and are negative.   Physical Exam Updated Vital Signs BP 110/65   Pulse (!) 57   Temp 98.4 F (36.9 C) (Oral)   Resp (!) 22   Ht 5' 7 (1.702 m)   Wt (!) 157.9 kg   LMP 09/11/2023   SpO2 100%   BMI 54.50 kg/m  Physical Exam Vitals and nursing note reviewed.  Constitutional:      General: She is not in acute  distress.    Appearance: Normal appearance. She is well-developed. She is not ill-appearing, toxic-appearing or diaphoretic.  HENT:     Head: Normocephalic and atraumatic.     Right Ear: External ear normal.     Left Ear: External ear normal.     Nose: Nose normal.     Mouth/Throat:     Mouth: Mucous membranes are moist.  Eyes:     Extraocular Movements: Extraocular movements intact.     Conjunctiva/sclera: Conjunctivae normal.  Cardiovascular:     Rate and Rhythm: Normal rate and regular rhythm.     Heart sounds: No murmur heard. Pulmonary:     Effort: Pulmonary effort is normal. No respiratory distress.     Breath sounds: Normal breath sounds. No wheezing or rales.  Chest:     Chest wall: Tenderness present.  Abdominal:     General: There is no distension.     Palpations: Abdomen is soft.     Tenderness: There is no abdominal tenderness.  Musculoskeletal:        General: No swelling. Normal range of motion.     Cervical back: Normal range of motion and neck supple.  Skin:    General: Skin is warm and dry.  Coloration: Skin is not jaundiced or pale.  Neurological:     General: No focal deficit present.     Mental Status: She is alert and oriented to person, place, and time.  Psychiatric:        Mood and Affect: Mood normal.        Behavior: Behavior normal.     ED Results / Procedures / Treatments   Labs (all labs ordered are listed, but only abnormal results are displayed) Labs Reviewed  COMPREHENSIVE METABOLIC PANEL - Abnormal; Notable for the following components:      Result Value   Glucose, Bld 114 (*)    Calcium 8.6 (*)    ALT 50 (*)    All other components within normal limits  CBC WITH DIFFERENTIAL/PLATELET - Abnormal; Notable for the following components:   Hemoglobin 10.7 (*)    HCT 34.3 (*)    MCH 25.8 (*)    All other components within normal limits  URINALYSIS, ROUTINE W REFLEX MICROSCOPIC - Abnormal; Notable for the following components:    Color, Urine AMBER (*)    APPearance HAZY (*)    Specific Gravity, Urine 1.032 (*)    Bilirubin Urine SMALL (*)    Protein, ur 30 (*)    All other components within normal limits  LIPASE, BLOOD  HCG, SERUM, QUALITATIVE  TROPONIN I (HIGH SENSITIVITY)    EKG EKG Interpretation Date/Time:  Wednesday September 20 2023 00:34:28 EST Ventricular Rate:  82 PR Interval:  158 QRS Duration:  86 QT Interval:  364 QTC Calculation: 425 R Axis:   35  Text Interpretation: Sinus rhythm Low voltage QRS Confirmed by Melvenia Motto ([id number]) on 09/20/2023 1:08:28 AM  Radiology CT Angio Chest PE W and/or Wo Contrast Result Date: 09/20/2023 CLINICAL DATA:  Pulmonary embolism (PE) suspected, high prob; Abdominal pain, acute, nonlocalized. Pleuritic chest pain, left flank pain EXAM: CT ANGIOGRAPHY CHEST CT ABDOMEN AND PELVIS WITH CONTRAST TECHNIQUE: Multidetector CT imaging of the chest was performed using the standard protocol during bolus administration of intravenous contrast. Multiplanar CT image reconstructions and MIPs were obtained to evaluate the vascular anatomy. Multidetector CT imaging of the abdomen and pelvis was performed using the standard protocol during bolus administration of intravenous contrast. RADIATION DOSE REDUCTION: This exam was performed according to the departmental dose-optimization program which includes automated exposure control, adjustment of the mA and/or kV according to patient size and/or use of iterative reconstruction technique. CONTRAST:  OMNIPAQUE  IOHEXOL  350 MG/ML SOLN COMPARISON:  None Available. FINDINGS: CTA CHEST FINDINGS Cardiovascular: Adequate opacification of the pulmonary arterial tree. No intraluminal filling defect identified to suggest acute pulmonary embolism. Central pulmonary arteries are of normal caliber. No significant coronary artery calcification. Cardiac size within normal limits. No pericardial effusion. No significant atherosclerotic calcification within  the thoracic aorta. No aortic aneurysm. 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 Mediastinum/Nodes: No enlarged mediastinal, hilar, or axillary lymph nodes. Thyroid  gland, trachea, and esophagus demonstrate no significant findings. Lungs/Pleura: Lungs are clear. No pleural effusion or pneumothorax. Musculoskeletal: No chest wall abnormality. No acute or significant osseous findings. Review of the MIP images confirms the above findings. CT ABDOMEN and PELVIS FINDINGS Hepatobiliary: Cholelithiasis without pericholecystic inflammatory change noted. Liver unremarkable. No intra or extrahepatic biliary ductal dilation. Pancreas: Unremarkable Spleen: Mild splenomegaly with the spleen measuring 15 cm in greatest dimension. No intrasplenic lesion identified. Adrenals/Urinary Tract: Adrenal glands are unremarkable. Kidneys are normal, without renal calculi, focal lesion, or hydronephrosis. Bladder is unremarkable. 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 The stomach, small bowel, and large bowel are otherwise unremarkable. No evidence of obstruction or focal inflammation. Appendix normal. No free intraperitoneal gas or fluid. Vascular/Lymphatic: No significant vascular findings are present. No enlarged abdominal or pelvic lymph nodes. Reproductive: The endometrium appears thickened measuring up to 3 cm in thickness, not well assessed on this examination. Pelvic organs are otherwise unremarkable. Other: No abdominal wall hernia Musculoskeletal: No acute or significant osseous findings. Review of the MIP images confirms the above findings. IMPRESSION: 1. No pulmonary embolism. No acute intrathoracic pathology identified. 2. No acute intra-abdominal pathology identified. 3. Cholelithiasis. 4. Mild splenomegaly. 5. Thickened endometrium measuring up to 3 cm in thickness, not well assessed on this examination. This may relate to endometrial hyperplasia or underlying  endometrial polyp in a patient of this age. Correlation with nonemergent pelvic sonography may be helpful for further evaluation. Electronically Signed   By: Dorethia Molt M.D.   On: 09/20/2023 04:05   CT ABDOMEN PELVIS W CONTRAST Result Date: 09/20/2023 CLINICAL DATA:  Pulmonary embolism (PE) suspected, high prob; Abdominal pain, acute, nonlocalized. Pleuritic chest pain, left flank pain EXAM: CT ANGIOGRAPHY CHEST CT ABDOMEN AND PELVIS WITH CONTRAST TECHNIQUE: Multidetector CT imaging of the chest was performed using the standard protocol during bolus administration of intravenous contrast. Multiplanar CT image reconstructions and MIPs were obtained to evaluate the vascular anatomy. Multidetector CT imaging of the abdomen and pelvis was performed using the standard protocol during bolus administration of intravenous contrast. RADIATION DOSE REDUCTION: This exam was performed according to the departmental dose-optimization program which includes automated exposure control, adjustment of the mA and/or kV according to patient size and/or use of iterative reconstruction technique. CONTRAST:  OMNIPAQUE  IOHEXOL  350 MG/ML SOLN COMPARISON:  None Available. FINDINGS: CTA CHEST FINDINGS Cardiovascular: Adequate opacification of the pulmonary arterial tree. No intraluminal filling defect identified to suggest acute pulmonary embolism. Central pulmonary arteries are of normal caliber. No significant coronary artery calcification. Cardiac size within normal limits. No pericardial effusion. No significant atherosclerotic calcification within the thoracic aorta. No aortic aneurysm. 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 Mediastinum/Nodes: No enlarged mediastinal, hilar, or axillary lymph nodes. Thyroid  gland, trachea, and esophagus demonstrate no significant findings. Lungs/Pleura: Lungs are clear. No pleural effusion or pneumothorax. Musculoskeletal: No chest wall abnormality. No acute or  significant osseous findings. Review of the MIP images confirms the above findings. CT ABDOMEN and PELVIS FINDINGS Hepatobiliary: Cholelithiasis without pericholecystic inflammatory change noted. Liver unremarkable. No intra or extrahepatic biliary ductal dilation. Pancreas: Unremarkable Spleen: Mild splenomegaly with the spleen measuring 15 cm in greatest dimension. No intrasplenic lesion identified. Adrenals/Urinary Tract: Adrenal glands are unremarkable. Kidneys are normal, without renal calculi, focal lesion, or hydronephrosis. Bladder is unremarkable. 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 The stomach, small bowel, and large bowel are otherwise unremarkable. No evidence of obstruction or focal inflammation. Appendix normal. No free intraperitoneal gas or fluid. Vascular/Lymphatic: No significant vascular findings are present. No enlarged abdominal or pelvic lymph nodes. Reproductive: The endometrium appears thickened measuring up to 3 cm in thickness, not well assessed on this examination. Pelvic organs are otherwise unremarkable. Other: No abdominal wall hernia Musculoskeletal: No acute or significant osseous findings. Review of the MIP images confirms the above findings. IMPRESSION: 1. No pulmonary embolism. No acute intrathoracic pathology identified. 2. No acute intra-abdominal pathology identified. 3. Cholelithiasis. 4. Mild splenomegaly. 5. Thickened endometrium measuring up to 3 cm in thickness, not  well assessed on this examination. This may relate to endometrial hyperplasia or underlying endometrial polyp in a patient of this age. Correlation with nonemergent pelvic sonography may be helpful for further evaluation. Electronically Signed   By: Dorethia Molt M.D.   On: 09/20/2023 04:05   DG Chest 2 View Result Date: 09/20/2023 CLINICAL DATA:  Shortness of breath EXAM: CHEST - 2 VIEW COMPARISON:  11/27/2015 FINDINGS: The heart size and mediastinal contours are  within normal limits. Both lungs are clear. The visualized skeletal structures are unremarkable. IMPRESSION: No active cardiopulmonary disease. Electronically Signed   By: Franky Stanford M.D.   On: 09/20/2023 01:29    Procedures Procedures    Medications Ordered in ED Medications  methocarbamol  (ROBAXIN ) tablet 1,000 mg (1,000 mg Oral Patient Refused/Not Given 09/20/23 0209)  lidocaine  (LIDODERM ) 5 % 1 patch (1 patch Transdermal Patch Applied 09/20/23 0210)  fentaNYL  (SUBLIMAZE ) injection 50 mcg (50 mcg Intravenous Given 09/20/23 0144)  ketorolac  (TORADOL ) 15 MG/ML injection 15 mg (15 mg Intravenous Given 09/20/23 0142)  lactated ringers  bolus 1,000 mL (1,000 mLs Intravenous New Bag/Given 09/20/23 0144)  iohexol  (OMNIPAQUE ) 350 MG/ML injection 100 mL (100 mLs Intravenous Contrast Given 09/20/23 0316)    ED Course/ Medical Decision Making/ A&P                                 Medical Decision Making Amount and/or Complexity of Data Reviewed Labs: ordered. Radiology: ordered.  Risk Prescription drug management.   This patient presents to the ED for concern of left flank pain, this involves an extensive number of treatment options, and is a complaint that carries with it a high risk of complications and morbidity.  The differential diagnosis includes nephrolithiasis, PE, pneumonia, musculoskeletal etiology   Co morbidities that complicate the patient evaluation  anxiety, anemia, depression   Additional history obtained:  Additional history obtained from N/A External records from outside source obtained and reviewed including EMR   Lab Tests:  I Ordered, and personally interpreted labs.  The pertinent results include: Normal kidney function, normal electrolytes, baseline anemia, no leukocytosis, no evidence of UTI or hematuria   Imaging Studies ordered:  I ordered imaging studies including chest x-ray, CTA chest, CT of abdomen and pelvis I independently visualized and interpreted  imaging which showed no acute findings I agree with the radiologist interpretation   Cardiac Monitoring: / EKG:  The patient was maintained on a cardiac monitor.  I personally viewed and interpreted the cardiac monitored which showed an underlying rhythm of: Sinus rhythm  Problem List / ED Course / Critical interventions / Medication management  Patient presenting for worsening left flank pain.  Pain is located in posterior lateral lower left ribs.  She states that it is at times worsened with movements and positional changes.  She does have some tenderness to deep palpation.  Patient also reports pleurisy.  Multimodal pain control was ordered.  Workup was initiated.  Patient's lab work and urinalysis were reassuring.  Imaging studies did not show any acute findings to explain her discomfort.  She has mild splenomegaly but this is unlikely to be related.  Incidental finding of endometrial hyperplasia was identified.  Patient was informed of this.  She did have improved symptoms while in the ED.  I suspect musculoskeletal etiology.  She was advised to continue NSAIDs.  Muscle relaxer was prescribed.  Patient was discharged in stable condition. I ordered medication including lidocaine   patch, fentanyl , Toradol , Robaxin  for analgesia Reevaluation of the patient after these medicines showed that the patient improved I have reviewed the patients home medicines and have made adjustments as needed   Social Determinants of Health:  Lives independently        Final Clinical Impression(s) / ED Diagnoses Final diagnoses:  Left flank pain    Rx / DC Orders ED Discharge Orders          Ordered    methocarbamol  (ROBAXIN ) 500 MG tablet  Every 8 hours PRN        09/20/23 0436    lidocaine  (LIDODERM ) 5 %  Every 24 hours        09/20/23 0436              Melvenia Motto, MD 09/20/23 [phone]

## 2023-09-20 NOTE — Discharge Instructions (Addendum)
 Test results today are reassuring.  There were no imaging findings to explain your left sided pain.  I suspected that the cause is in the muscle of your chest wall.  Continue Aleve.  A prescription for lidocaine  patches and muscle relaxers were sent to your pharmacy.  Patches can be switched out daily.  Take muscle relaxers as needed.  The lining of your uterus did appear thickened on CT scan.  This would not be related to your pain but is something you should be aware of.  Follow-up with your primary care doctor about this for possible repeat imaging.  Return to the emergency department for any new or worsening symptoms of concern.

## 2023-11-13 NOTE — Progress Notes (Unsigned)
 [facility] at [facility]  HEMATOLOGY NEW VISIT  Lewis Moccasin, MD  REASON FOR REFERRAL: Iron deficiency anemia   HISTORY OF PRESENT ILLNESS: Alexandra Newton 33 y.o. female referred for iron deficiency anemia.  Patient has a history of gastric sleeve surgery.  She reports no excessive menstrual bleeding, with cycles typically lasting four to seven days and using a super tampon every few hours. She denies nocturnal changes or significant clotting, although she notes one recent cycle was heavier than usual. She denies any blood in her stools or black colored stools. The patient underwent a gastric sleeve procedure in 2017 and has been taking iron supplements for about a year, but her iron levels remain low. She has been experiencing weight gain after an initial loss of 90 pounds post-surgery, but is currently taking phentermine and has started losing weight again.   She recently had an ultrasound due to thickening in the endometrium noted on a CT scan. The ultrasound showed a thick uterine wall and possibly a small cyst on the right ovary. She is scheduled for a hysteroscopy and D&C in May to investigate the thickening for possible precancerous changes.  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 She denies any dizziness or increased shortness of breath.  She does report some fatigue but no other complaints today.  Denies abdominal pain, melena, hematochezia.  Overall has been doing good.  Patient is a non-smoker, drinks alcohol socially.  Has no family history of cancer.  I have reviewed the past medical history, past surgical history, social history and family history with the patient   ALLERGIES:  has no known allergies.  MEDICATIONS:  Current Outpatient Medications  Medication Sig Dispense Refill   ALPRAZolam (XANAX) 0.25 MG tablet Take by  mouth.     busPIRone (BUSPAR) 7.5 MG tablet Take 1 tablet (7.5 mg total) by mouth 2 (two) times daily. For anxiety 60 tablet 3   ferrous sulfate 325 (65 FE) MG EC tablet Take 1 tablet (325 mg total) by mouth every other day. 45 tablet 3   FLUoxetine (PROZAC) 20 MG tablet Take 1 tablet (20 mg total) by mouth daily. 30 tablet 3   methocarbamol (ROBAXIN) 500 MG tablet Take 2 tablets (1,000 mg total) by mouth every 8 (eight) hours as needed. 24 tablet 0   phentermine (ADIPEX-P) 37.5 MG tablet Take 37.5 mg by mouth every morning.     Vitamin D, Ergocalciferol, (DRISDOL) 1.25 MG (50000 UNIT) CAPS capsule Take 50,000 Units by mouth once a week.     No current facility-administered medications for this visit.     REVIEW OF SYSTEMS:   Constitutional: Denies fevers, chills or night sweats Eyes: Denies blurriness of vision Ears, nose, mouth, throat, and face: Denies mucositis or sore throat Respiratory: Denies cough, dyspnea or wheezes Cardiovascular: Denies palpitation, chest discomfort or lower extremity swelling Gastrointestinal:  Denies nausea, heartburn or change in bowel habits Skin: Denies abnormal skin rashes Lymphatics: Denies new lymphadenopathy or easy bruising Neurological:Denies numbness, tingling or new weaknesses Behavioral/Psych: Mood is stable, no new changes  All other systems were reviewed with the patient and are negative.  PHYSICAL EXAMINATION:   Vitals:   11/14/23 0840  BP: 138/79  Pulse: 73  Resp: 16  Temp: 97.8 F (36.6 C)  SpO2: 96%    GENERAL:alert, no distress and comfortable SKIN: skin color, texture, turgor are  normal, no rashes or significant lesions LUNGS: clear to auscultation and percussion with normal breathing effort HEART: regular rate & rhythm and no murmurs and no lower extremity edema ABDOMEN:abdomen soft, non-tender and normal bowel sounds Musculoskeletal:no cyanosis of digits and no clubbing  NEURO: alert & oriented x 3 with fluent  speech  LABORATORY DATA:  I have reviewed the data as listed and labs from her primary care done on 10/31/2023 Iron: 26, TIBC: 340, percent sat: 8, ferritin: 15 CMP: WNL CBC: WBC: 7.2, hemoglobin: 10.3, platelets: 337 CRP: 13.3-elevated  Lab Results  Component Value Date   WBC 9.6 09/20/2023   NEUTROABS 7.1 09/20/2023   HGB 10.7 (L) 09/20/2023   HCT 34.3 (L) 09/20/2023   MCV 82.7 09/20/2023   PLT 311 09/20/2023       Chemistry      Component Value Date/Time   NA 138 09/20/2023 0139   K 3.7 09/20/2023 0139   CL 106 09/20/2023 0139   CO2 23 09/20/2023 0139   BUN 9 09/20/2023 0139   CREATININE 0.48 09/20/2023 0139   CREATININE 0.48 (L) 01/09/2018 1210      Component Value Date/Time   CALCIUM 8.6 (L) 09/20/2023 0139   ALKPHOS 74 09/20/2023 0139   AST 27 09/20/2023 0139   ALT 50 (H) 09/20/2023 0139   BILITOT 0.6 09/20/2023 0139       RADIOGRAPHIC STUDIES: I have personally reviewed the radiological images as listed and agreed with the findings in the report.  CT ABDOMEN PELVIS W CONTRAST CLINICAL DATA:  Pulmonary embolism (PE) suspected, high prob; Abdominal pain, acute, nonlocalized. Pleuritic chest pain, left flank pain  EXAM: CT ANGIOGRAPHY CHEST  CT ABDOMEN AND PELVIS WITH CONTRAST  TECHNIQUE: Multidetector CT imaging of the chest was performed using the standard protocol during bolus administration of intravenous contrast. Multiplanar CT image reconstructions and MIPs were obtained to evaluate the vascular anatomy. Multidetector CT imaging of the abdomen and pelvis was performed using the standard protocol during bolus administration of intravenous contrast.  RADIATION DOSE REDUCTION: This exam was performed according to the departmental dose-optimization program which includes automated exposure control, adjustment of the mA and/or kV according to patient size and/or use of iterative reconstruction technique.  CONTRAST:  OMNIPAQUE IOHEXOL 350  MG/ML SOLN  COMPARISON:  None Available.  FINDINGS: CTA CHEST FINDINGS  Cardiovascular: Adequate opacification of the pulmonary arterial tree. No intraluminal filling defect identified to suggest acute pulmonary embolism. Central pulmonary arteries are of normal caliber. No significant coronary artery calcification. Cardiac size within normal limits. No pericardial effusion. No significant atherosclerotic calcification within the thoracic aorta. No aortic aneurysm. 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  Mediastinum/Nodes: No enlarged mediastinal, hilar, or axillary lymph nodes. Thyroid gland, trachea, and esophagus demonstrate no significant findings.  Lungs/Pleura: Lungs are clear. No pleural effusion or pneumothorax.  Musculoskeletal: No chest wall abnormality. No acute or significant osseous findings.  Review of the MIP images confirms the above findings.  CT ABDOMEN and PELVIS FINDINGS  Hepatobiliary: Cholelithiasis without pericholecystic inflammatory change noted. Liver unremarkable. No intra or extrahepatic biliary ductal dilation.  Pancreas: Unremarkable  Spleen: Mild splenomegaly with the spleen measuring 15 cm in greatest dimension. No intrasplenic lesion identified.  Adrenals/Urinary Tract: Adrenal glands are unremarkable. Kidneys are normal, without renal calculi, focal lesion, or hydronephrosis. Bladder is unremarkable.  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 The stomach, small bowel, and large bowel are otherwise unremarkable. No evidence of obstruction or  focal inflammation. Appendix normal. No free intraperitoneal gas or fluid.  Vascular/Lymphatic: No significant vascular findings are present. No enlarged abdominal or pelvic lymph nodes.  Reproductive: The endometrium appears thickened measuring up to 3 cm in thickness, not well assessed on this examination. Pelvic organs are  otherwise unremarkable.  Other: No abdominal wall hernia  Musculoskeletal: No acute or significant osseous findings.  Review of the MIP images confirms the above findings.  IMPRESSION: 1. No pulmonary embolism. No acute intrathoracic pathology identified. 2. No acute intra-abdominal pathology identified. 3. Cholelithiasis. 4. Mild splenomegaly. 5. Thickened endometrium measuring up to 3 cm in thickness, not well assessed on this examination. This may relate to endometrial hyperplasia or underlying endometrial polyp in a patient of this age. Correlation with nonemergent pelvic sonography may be helpful for further evaluation.  Electronically Signed   By: Helyn Numbers M.D.   On: 09/20/2023 04:05 CT Angio Chest PE W and/or Wo Contrast CLINICAL DATA:  Pulmonary embolism (PE) suspected, high prob; Abdominal pain, acute, nonlocalized. Pleuritic chest pain, left flank pain  EXAM: CT ANGIOGRAPHY CHEST  CT ABDOMEN AND PELVIS WITH CONTRAST  TECHNIQUE: Multidetector CT imaging of the chest was performed using the standard protocol during bolus administration of intravenous contrast. Multiplanar CT image reconstructions and MIPs were obtained to evaluate the vascular anatomy. Multidetector CT imaging of the abdomen and pelvis was performed using the standard protocol during bolus administration of intravenous contrast.  RADIATION DOSE REDUCTION: This exam was performed according to the departmental dose-optimization program which includes automated exposure control, adjustment of the mA and/or kV according to patient size and/or use of iterative reconstruction technique.  CONTRAST:  OMNIPAQUE IOHEXOL 350 MG/ML SOLN  COMPARISON:  None Available.  FINDINGS: CTA CHEST FINDINGS  Cardiovascular: Adequate opacification of the pulmonary arterial tree. No intraluminal filling defect identified to suggest acute pulmonary embolism. Central pulmonary arteries are of  normal caliber. No significant coronary artery calcification. Cardiac size within normal limits. No pericardial effusion. No significant atherosclerotic calcification within the thoracic aorta. No aortic aneurysm. 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  Mediastinum/Nodes: No enlarged mediastinal, hilar, or axillary lymph nodes. Thyroid gland, trachea, and esophagus demonstrate no significant findings.  Lungs/Pleura: Lungs are clear. No pleural effusion or pneumothorax.  Musculoskeletal: No chest wall abnormality. No acute or significant osseous findings.  Review of the MIP images confirms the above findings.  CT ABDOMEN and PELVIS FINDINGS  Hepatobiliary: Cholelithiasis without pericholecystic inflammatory change noted. Liver unremarkable. No intra or extrahepatic biliary ductal dilation.  Pancreas: Unremarkable  Spleen: Mild splenomegaly with the spleen measuring 15 cm in greatest dimension. No intrasplenic lesion identified.  Adrenals/Urinary Tract: Adrenal glands are unremarkable. Kidneys are normal, without renal calculi, focal lesion, or hydronephrosis. Bladder is unremarkable.  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 The stomach, small bowel, and large bowel are otherwise unremarkable. No evidence of obstruction or focal inflammation. Appendix normal. No free intraperitoneal gas or fluid.  Vascular/Lymphatic: No significant vascular findings are present. No enlarged abdominal or pelvic lymph nodes.  Reproductive: The endometrium appears thickened measuring up to 3 cm in thickness, not well assessed on this examination. Pelvic organs are otherwise unremarkable.  Other: No abdominal wall hernia  Musculoskeletal: No acute or significant osseous findings.  Review of the MIP images confirms the above findings.  IMPRESSION: 1. No pulmonary embolism. No acute intrathoracic pathology identified. 2.  No acute intra-abdominal pathology identified. 3. Cholelithiasis. 4. Mild splenomegaly. 5. Thickened endometrium measuring  up to 3 cm in thickness, not well assessed on this examination. This may relate to endometrial hyperplasia or underlying endometrial polyp in a patient of this age. Correlation with nonemergent pelvic sonography may be helpful for further evaluation.  Electronically Signed   By: Helyn Numbers M.D.   On: 09/20/2023 04:05 DG Chest 2 View CLINICAL DATA:  Shortness of breath  EXAM: CHEST - 2 VIEW  COMPARISON:  11/27/2015  FINDINGS: The heart size and mediastinal contours are within normal limits. Both lungs are clear. The visualized skeletal structures are unremarkable.  IMPRESSION: No active cardiopulmonary disease.  Electronically Signed   By: Deatra Robinson M.D.   On: 09/20/2023 01:29    ASSESSMENT & PLAN:   Patient is a 33 year old female referred for iron deficiency anemia   IDA (iron deficiency anemia) Iron deficiency anemia likely secondary to menstrual blood loss versus malabsorption from gastric sleeve procedure.  Patient has been taking oral iron without improvement for more than a year.  Iron labs consistent with severe iron deficiency with anemia. -The most likely cause of her anemia is due to chronic blood loss or malabsorption syndrome. We discussed some of the risks, benefits, and alternatives of intravenous iron infusions. The patient is symptomatic from anemia and the iron level is critically low. She tolerated oral iron supplement poorly and desires to achieved higher levels of iron faster for adequate hematopoesis. Some of the side-effects to be expected including risks of infusion reactions, phlebitis, headaches, nausea and fatigue.  The patient is willing to proceed. Patient education material was dispensed.  Goal is to keep ferritin level greater than 50 and resolution of anemia  -Continue oral iron but take it every other day.  Use  MiraLAX for constipation. -Continue to follow with gynecology and also recommend evaluation for menorrhagia. -If iron labs do not improve with IV iron and menorrhagia evaluation, will refer to GI for further evaluation.    Return to clinic in 6 weeks with labs for assessment of response to IV iron and further management.  Endometrial thickening on ultrasound Endometrial thickening observed on imaging. Scheduled for hysteroscopy and D&C to assess for potential precancerous changes. Thickening may contribute to iron deficiency anemia if associated with abnormal uterine bleeding. - Proceed with scheduled hysteroscopy and D&C on May 16th to evaluate endometrial thickening    Orders Placed This Encounter  Procedures   Ferritin    Standing Status:   Future    Expected Date:   12/26/2023    Expiration Date:   11/13/2024   Folate    Standing Status:   Future    Expected Date:   12/26/2023    Expiration Date:   11/13/2024   Vitamin B12    Standing Status:   Future    Expected Date:   12/26/2023    Expiration Date:   11/13/2024   CBC with Differential/Platelet    Standing Status:   Future    Expected Date:   12/26/2023    Expiration Date:   11/13/2024   Comprehensive metabolic panel with GFR    Standing Status:   Future    Expected Date:   12/26/2023    Expiration Date:   11/13/2024   Iron and TIBC    Standing Status:   Future    Expected Date:   12/26/2023    Expiration Date:   11/13/2024    The total time spent in the appointment was 40 minutes encounter with patients including review of chart and various  tests results, discussions about plan of care and coordination of care plan   All questions were answered. The patient knows to call the clinic with any problems, questions or concerns. No barriers to learning was detected.   Cindie Crumbly, MD 4/1/20251:55 PM

## 2023-11-14 ENCOUNTER — Inpatient Hospital Stay: Attending: Oncology | Admitting: Oncology

## 2023-11-14 ENCOUNTER — Inpatient Hospital Stay

## 2023-11-14 VITALS — BP 138/79 | HR 73 | Temp 97.8°F | Resp 16 | Ht 67.0 in | Wt 352.5 lb

## 2023-11-14 DIAGNOSIS — R5383 Other fatigue: Secondary | ICD-10-CM | POA: Insufficient documentation

## 2023-11-14 DIAGNOSIS — K5900 Constipation, unspecified: Secondary | ICD-10-CM | POA: Insufficient documentation

## 2023-11-14 DIAGNOSIS — R9389 Abnormal findings on diagnostic imaging of other specified body structures: Secondary | ICD-10-CM | POA: Diagnosis not present

## 2023-11-14 DIAGNOSIS — D508 Other iron deficiency anemias: Secondary | ICD-10-CM | POA: Diagnosis present

## 2023-11-14 DIAGNOSIS — K8020 Calculus of gallbladder without cholecystitis without obstruction: Secondary | ICD-10-CM | POA: Diagnosis not present

## 2023-11-14 DIAGNOSIS — D509 Iron deficiency anemia, unspecified: Secondary | ICD-10-CM | POA: Insufficient documentation

## 2023-11-14 DIAGNOSIS — Z79899 Other long term (current) drug therapy: Secondary | ICD-10-CM | POA: Insufficient documentation

## 2023-11-14 DIAGNOSIS — N8500 Endometrial hyperplasia, unspecified: Secondary | ICD-10-CM | POA: Diagnosis not present

## 2023-11-14 DIAGNOSIS — Z9884 Bariatric surgery status: Secondary | ICD-10-CM | POA: Insufficient documentation

## 2023-11-14 DIAGNOSIS — N840 Polyp of corpus uteri: Secondary | ICD-10-CM | POA: Diagnosis not present

## 2023-11-14 MED ORDER — FERROUS SULFATE 325 (65 FE) MG PO TBEC: 325.0000 mg | DELAYED_RELEASE_TABLET | ORAL | 3 refills | Status: AC

## 2023-11-14 NOTE — Patient Instructions (Signed)
 VISIT SUMMARY:  You came in today for an iron infusion due to iron deficiency anemia. We discussed your history of gastric sleeve surgery, recent weight changes, and the upcoming hysteroscopy and D&C to investigate endometrial thickening. You reported no excessive menstrual bleeding or other major complaints.  YOUR PLAN:  -IRON DEFICIENCY ANEMIA: Iron deficiency anemia means you have low levels of iron in your blood, which can cause fatigue and weakness. This can be due to chronic blood loss or poor absorption of iron, especially after gastric sleeve surgery. We will give you two doses of IV iron one week apart to help increase your iron levels. You should also take one 65 mg iron tablet every other day to improve absorption and reduce constipation. We will check your iron levels again in six weeks. If your iron levels remain low, we may refer you to a gastroenterologist to check for other sources of blood loss.  -ENDOMETRIAL THICKENING: Endometrial thickening means the lining of your uterus is thicker than normal, which can sometimes be a sign of precancerous changes. You are scheduled for a hysteroscopy and D&C on May 16th to take a closer look and get samples for testing. This procedure will help [place] understand if the thickening is contributing to your iron deficiency anemia.  -GASTRIC SLEEVE SURGERY: Gastric sleeve surgery can affect how your body absorbs nutrients, including iron and vitamin B12. We will monitor your vitamin B12 levels during your next blood check to ensure they are adequate, as low levels can also cause anemia.   INSTRUCTIONS:  Please follow up with the infusion center for your scheduled IV iron doses. We will re-evaluate your iron levels in six weeks. Continue with your oral iron supplementation as directed. Attend your hysteroscopy and D&C appointment on May 16th. If your iron levels remain low after these evaluations, we may refer you to a gastroenterologist.

## 2023-11-14 NOTE — Assessment & Plan Note (Signed)
 Endometrial thickening observed on imaging. Scheduled for hysteroscopy and D&C to assess for potential precancerous changes. Thickening may contribute to iron deficiency anemia if associated with abnormal uterine bleeding. - Proceed with scheduled hysteroscopy and D&C on May 16th to evaluate endometrial thickening

## 2023-11-14 NOTE — Assessment & Plan Note (Signed)
 Iron deficiency anemia likely secondary to menstrual blood loss versus malabsorption from gastric sleeve procedure.  Patient has been taking oral iron without improvement for more than a year.  Iron labs consistent with severe iron deficiency with anemia. -The most likely cause of her anemia is due to chronic blood loss or malabsorption syndrome. We discussed some of the risks, benefits, and alternatives of intravenous iron infusions. The patient is symptomatic from anemia and the iron level is critically low. She tolerated oral iron supplement poorly and desires to achieved higher levels of iron faster for adequate hematopoesis. Some of the side-effects to be expected including risks of infusion reactions, phlebitis, headaches, nausea and fatigue.  The patient is willing to proceed. Patient education material was dispensed.  Goal is to keep ferritin level greater than 50 and resolution of anemia  -Continue oral iron but take it every other day.  Use MiraLAX for constipation. -Continue to follow with gynecology and also recommend evaluation for menorrhagia. -If iron labs do not improve with IV iron and menorrhagia evaluation, will refer to GI for further evaluation.    Return to clinic in 6 weeks with labs for assessment of response to IV iron and further management.

## 2023-11-14 NOTE — Addendum Note (Signed)
 Addended byCindie Crumbly on: 11/14/2023 02:22 PM   Modules accepted: Level of Service

## 2023-11-17 ENCOUNTER — Inpatient Hospital Stay

## 2023-11-17 VITALS — BP 113/71 | HR 76 | Temp 98.0°F | Resp 16

## 2023-11-17 DIAGNOSIS — D509 Iron deficiency anemia, unspecified: Secondary | ICD-10-CM

## 2023-11-17 DIAGNOSIS — D508 Other iron deficiency anemias: Secondary | ICD-10-CM | POA: Diagnosis not present

## 2023-11-17 MED ORDER — CETIRIZINE HCL 10 MG PO TABS
10.0000 mg | ORAL_TABLET | Freq: Once | ORAL | Status: AC
  Administered 2023-11-17: 10 mg via ORAL
  Filled 2023-11-17: qty 1

## 2023-11-17 MED ORDER — ACETAMINOPHEN 325 MG PO TABS
650.0000 mg | ORAL_TABLET | Freq: Once | ORAL | Status: AC
  Administered 2023-11-17: 650 mg via ORAL
  Filled 2023-11-17: qty 2

## 2023-11-17 MED ORDER — SODIUM CHLORIDE 0.9 % IV SOLN
500.0000 mg | Freq: Once | INTRAVENOUS | Status: AC
  Administered 2023-11-17: 500 mg via INTRAVENOUS
  Filled 2023-11-17: qty 20

## 2023-11-17 MED ORDER — SODIUM CHLORIDE 0.9 % IV SOLN: INTRAVENOUS | Status: DC

## 2023-11-17 NOTE — Progress Notes (Signed)
 Patient presents today for 500 Venofer infusion per providers order.  Vital signs WNL.  Patient has no new complaints at this time.  Peripheral IV started and blood return noted prior to infusion.

## 2023-11-17 NOTE — Patient Instructions (Signed)

## 2023-11-17 NOTE — Progress Notes (Signed)
Venofer iron infusion given per orders. Patient tolerated it well without problems. Vitals stable and discharged home from clinic ambulatory. Follow up as scheduled.

## 2023-11-23 MED FILL — Iron Sucrose Inj 20 MG/ML (Fe Equiv): INTRAVENOUS | Qty: 25 | Status: AC

## 2023-11-24 ENCOUNTER — Inpatient Hospital Stay

## 2023-11-24 VITALS — BP 116/70 | HR 52 | Temp 97.4°F | Resp 20

## 2023-11-24 DIAGNOSIS — D508 Other iron deficiency anemias: Secondary | ICD-10-CM | POA: Diagnosis not present

## 2023-11-24 DIAGNOSIS — D509 Iron deficiency anemia, unspecified: Secondary | ICD-10-CM

## 2023-11-24 MED ORDER — SODIUM CHLORIDE 0.9 % IV SOLN
500.0000 mg | Freq: Once | INTRAVENOUS | Status: AC
  Administered 2023-11-24: 500 mg via INTRAVENOUS
  Filled 2023-11-24: qty 20

## 2023-11-24 MED ORDER — CETIRIZINE HCL 10 MG PO TABS
10.0000 mg | ORAL_TABLET | Freq: Once | ORAL | Status: AC
  Administered 2023-11-24: 10 mg via ORAL
  Filled 2023-11-24: qty 1

## 2023-11-24 MED ORDER — SODIUM CHLORIDE 0.9 % IV SOLN: INTRAVENOUS | Status: DC

## 2023-11-24 MED ORDER — ACETAMINOPHEN 325 MG PO TABS
650.0000 mg | ORAL_TABLET | Freq: Once | ORAL | Status: AC
  Administered 2023-11-24: 650 mg via ORAL
  Filled 2023-11-24: qty 2

## 2023-11-24 NOTE — Progress Notes (Signed)
 Patient presents today for Venofer infusion per providers order.  Vital signs WNL.  Patient has no new complaints at this time.  Peripheral IV started and blood return noted pre and post infusion.  Stable during infusion without adverse affects.  Vital signs stable.  No complaints at this time.  Discharge from clinic ambulatory in stable condition.  Alert and oriented X 3.  Follow up with [facility] as scheduled.

## 2023-11-24 NOTE — Patient Instructions (Signed)

## 2023-12-15 ENCOUNTER — Encounter (HOSPITAL_COMMUNITY): Payer: Self-pay | Admitting: Obstetrics and Gynecology

## 2023-12-15 ENCOUNTER — Other Ambulatory Visit: Payer: Self-pay

## 2023-12-15 NOTE — Progress Notes (Signed)
 PCP - Dr. Rayma Calandra Cardiologist - denies  PPM/ICD - denies   Chest x-ray - 09/20/23 EKG - 09/20/23 Stress Test - denies ECHO - denies Cardiac Cath - denies  CPAP - denies  DM- denies. Pt states that she takes Metformin for weight loss  ASA/Blood Thinner Instructions: n/a   ERAS Protcol - clears until 0430  COVID TEST- n/a  Anesthesia review: no  Patient verbally denies any shortness of breath, fever, cough and chest pain during phone call      Questions were answered. Patient verbalized understanding of instructions.

## 2023-12-15 NOTE — Pre-Procedure Instructions (Signed)
-------------    SDW INSTRUCTIONS given:  Your procedure is scheduled on 5/6.  Report to [facility] Main Entrance "A" at 05:30 A.M., and check in at the Admitting office.  Any questions or running late day of surgery: call [phone]    Remember:  Do not eat after midnight the night before your surgery  You may drink clear liquids until 04:30 AM the morning of your surgery.   Clear liquids allowed are: Water , Non-Citrus Juices (without pulp), Carbonated Beverages, Clear Tea, Black Coffee Only, and Gatorade    Take these medicines the morning of surgery with A SIP OF WATER   busPIRone  (BUSPAR )     May take these medicines IF NEEDED: ALPRAZolam (XANAX)    STOP taking Phentermine 2 weeks prior to surgery or ASAP  As of today, STOP taking any Aspirin (unless otherwise instructed by your surgeon) Aleve, Naproxen, Ibuprofen, Motrin, Advil, Goody's, BC's, all herbal medications, fish oil, and all vitamins.  Do NOT take metFORMIN (GLUCOPHAGE-XR) day of surgery.  Do NOT Smoke (Tobacco/Vaping) 24 hours prior to your procedure  If you use a CPAP at night, you may bring all equipment for your overnight stay.     You will be asked to remove any contacts, glasses, piercing's, hearing aid's, dentures/partials prior to surgery. Please bring cases for these items if needed.     Patients discharged the day of surgery will not be allowed to drive home, and someone needs to stay with them for 24 hours.  SURGICAL WAITING ROOM VISITATION Patients may have no more than 2 support people in the waiting area - these visitors may rotate.   Pre-op nurse will coordinate an appropriate time for 1 ADULT support person, who may not rotate, to accompany patient in pre-op.  Children under the age of 83 must have an adult with them who is not the patient and must remain in the main waiting area with an adult.  If the patient needs to stay at the hospital during part of their recovery, the visitor guidelines  for inpatient rooms apply.  Please refer to the [facility] website for the visitor guidelines for any additional information.   Special instructions:   [place]- Preparing For Surgery   Please follow these instructions carefully.   Shower the NIGHT BEFORE SURGERY and the MORNING OF SURGERY with DIAL Soap.   Pat yourself dry with a CLEAN TOWEL.  Wear CLEAN PAJAMAS to bed the night before surgery  Place CLEAN SHEETS on your bed the night of your first shower and DO NOT SLEEP WITH PETS.   Additional instructions for the day of surgery: DO NOT APPLY any lotions, deodorants, cologne, or perfumes.   Do not wear jewelry or makeup Do not wear nail polish, gel polish, artificial nails, or any other type of covering on natural nails (fingers and toes) Do not bring valuables to the hospital. [facility] is not responsible for valuables/personal belongings. Put on clean/comfortable clothes.  Please brush your teeth.  Ask your nurse before applying any prescription medications to the skin.

## 2023-12-18 NOTE — H&P (Signed)
 Preoperative History and Physical  Alexandra Newton is an 33 y.o. female. G0 who presents for scheduled surgery for evaluation of irregular cycles and incidentally found thickened endometrial stripe (initially on CT) on US .  US  findings from the office: THICKENED INHOMOGENEOUS ENDOMETRIUM WITH SMALL CYSTIC AREAS- ? POSSIBLE DENSITY VS THICKENED ENDO. EMS 29.81mm  Pertinent Gynecological History: Menses: irregular cycles Contraception: abstinence DES exposure: unknown Blood transfusions: none Sexually transmitted diseases: no past history Previous GYN Procedures:  none   Last pap: normal Date: 2021 OB History: G0  Menstrual History: Patient's last menstrual period was 10/11/2023 (approximate).    Past Medical History:  Diagnosis Date   Allergy    seasonal   Anxiety    Borderline anemia    pt had 2 iron  infusions in March/April 2025   Depression    Depression    [employer]reesia 10/11/2020   History of bronchitis as a child    Panic attacks     Past Surgical History:  Procedure Laterality Date   LAPAROSCOPIC GASTRIC SLEEVE RESECTION N/A 02/22/2016   Procedure: LAPAROSCOPIC GASTRIC SLEEVE RESECTION WITH UPPER ENDO;  Surgeon: Ayesha Lente, MD;  Location: [facility];  Service: General;  Laterality: N/A;   WISDOM TOOTH EXTRACTION     4    Family History  Problem Relation Age of Onset   Arthritis Mother    Cancer Mother        ovarian   Depression Mother    Diabetes Mother    Hypertension Mother    Obesity Mother    Alcohol abuse Father    Arthritis Father    Depression Father    Arthritis Sister    Depression Sister    Early death Maternal Grandmother    Obesity Maternal Grandmother    Cancer Maternal Grandmother        Lung    Cancer Maternal Grandfather        prostate   Diabetes Maternal Grandfather    Hypertension Maternal Grandfather    Arthritis Paternal Grandmother    Depression Paternal Grandmother    Diabetes Paternal Grandmother    Hypertension Paternal  Grandmother    Hyperlipidemia Paternal Grandfather    Stroke Paternal Grandfather    Cancer Maternal Aunt        breast   Depression Maternal Aunt     Social History:  reports that she has never smoked. She has never used smokeless tobacco. She reports current alcohol use. She reports that she does not use drugs.  Allergies:  Allergies  Allergen Reactions   Chocolate     Inflammation    Pork-Derived Products     Inflammation     No medications prior to admission.   ROS otherwise negative     12/15/2023    2:35 PM 11/24/2023   12:33 PM 11/24/2023    8:00 AM  Vitals with BMI  Height 5\' 7"     Weight 352 lbs 8 oz    BMI 55.2    Systolic  116 136  Diastolic  70 78  Pulse  52 70   Height 5\' 7"  (1.702 m), weight (!) 159.9 kg, last menstrual period 10/11/2023.  Constitutional:      Appearance: Normal appearance.  HENT:     Head: Normocephalic.  Cardiovascular:     Rate and Rhythm: Normal rate Abdominal:     General: Abdomen is nontender, nondistended Neurological:     Mental Status: She is alert.    Assessment/Plan: Alexandra Newton is an  33 y.o. female. G0 who presents for scheduled surgery for evaluation of irregular cycles and incidentally found thickened endometrial stripe (initially on CT) on US . Plan for hysteroscopy, dilation and curettage, possible polypectomy, Mirena IUD insertion. Risks discussed including infection, bleeding, damage to surrounding structures, need for additional procedures, postoperative DVT. All questions answered. Consent signed. Plan discharge from PACU.  [name] 12/18/2023, 10:59 AM

## 2023-12-19 ENCOUNTER — Encounter (HOSPITAL_COMMUNITY): Payer: Self-pay | Admitting: Obstetrics and Gynecology

## 2023-12-19 ENCOUNTER — Ambulatory Visit (HOSPITAL_COMMUNITY)
Admission: RE | Admit: 2023-12-19 | Discharge: 2023-12-19 | Disposition: A | Discharge status: Home / Self Care | Attending: Obstetrics and Gynecology | Admitting: Obstetrics and Gynecology

## 2023-12-19 ENCOUNTER — Other Ambulatory Visit: Payer: Self-pay

## 2023-12-19 ENCOUNTER — Encounter (HOSPITAL_COMMUNITY)
Admission: RE | Disposition: A | Discharge status: Home / Self Care | Payer: Self-pay | Source: Home / Self Care | Attending: Obstetrics and Gynecology

## 2023-12-19 ENCOUNTER — Ambulatory Visit (HOSPITAL_COMMUNITY): Payer: Self-pay | Admitting: Anesthesiology

## 2023-12-19 ENCOUNTER — Ambulatory Visit (HOSPITAL_BASED_OUTPATIENT_CLINIC_OR_DEPARTMENT_OTHER): Payer: Self-pay | Admitting: Anesthesiology

## 2023-12-19 DIAGNOSIS — Z9884 Bariatric surgery status: Secondary | ICD-10-CM | POA: Insufficient documentation

## 2023-12-19 DIAGNOSIS — R9389 Abnormal findings on diagnostic imaging of other specified body structures: Secondary | ICD-10-CM | POA: Diagnosis not present

## 2023-12-19 DIAGNOSIS — N926 Irregular menstruation, unspecified: Secondary | ICD-10-CM | POA: Insufficient documentation

## 2023-12-19 DIAGNOSIS — N840 Polyp of corpus uteri: Secondary | ICD-10-CM | POA: Diagnosis not present

## 2023-12-19 DIAGNOSIS — J45909 Unspecified asthma, uncomplicated: Secondary | ICD-10-CM

## 2023-12-19 DIAGNOSIS — E6689 Other obesity not elsewhere classified: Secondary | ICD-10-CM | POA: Diagnosis not present

## 2023-12-19 DIAGNOSIS — N8500 Endometrial hyperplasia, unspecified: Secondary | ICD-10-CM | POA: Diagnosis present

## 2023-12-19 DIAGNOSIS — Z30430 Encounter for insertion of intrauterine contraceptive device: Secondary | ICD-10-CM | POA: Insufficient documentation

## 2023-12-19 DIAGNOSIS — Z6841 Body Mass Index (BMI) 40.0 and over, adult: Secondary | ICD-10-CM | POA: Insufficient documentation

## 2023-12-19 DIAGNOSIS — F418 Other specified anxiety disorders: Secondary | ICD-10-CM | POA: Diagnosis not present

## 2023-12-19 HISTORY — PX: INTRAUTERINE DEVICE (IUD) INSERTION: SHX5877

## 2023-12-19 HISTORY — PX: MYOSURE RESECTION: SHX7611

## 2023-12-19 HISTORY — PX: HYSTEROSCOPY WITH D & C: SHX1775

## 2023-12-19 LAB — CBC
HCT: 36.4 % (ref 36.0–46.0)
Hemoglobin: 11.8 g/dL — ABNORMAL LOW (ref 12.0–15.0)
MCH: 27.2 pg (ref 26.0–34.0)
MCHC: 32.4 g/dL (ref 30.0–36.0)
MCV: 83.9 fL (ref 80.0–100.0)
Platelets: 285 10*3/uL (ref 150–400)
RBC: 4.34 MIL/uL (ref 3.87–5.11)
RDW: 14.8 % (ref 11.5–15.5)
WBC: 7.2 10*3/uL (ref 4.0–10.5)
nRBC: 0 % (ref 0.0–0.2)

## 2023-12-19 LAB — ABO/RH: ABO/RH(D): A POS

## 2023-12-19 LAB — POCT PREGNANCY, URINE
Preg Test, Ur: NEGATIVE
Preg Test, Ur: NEGATIVE

## 2023-12-19 LAB — RPR: RPR Ser Ql: NONREACTIVE

## 2023-12-19 SURGERY — DILATATION AND CURETTAGE /HYSTEROSCOPY
Anesthesia: General | Site: Uterus

## 2023-12-19 MED ORDER — FENTANYL CITRATE (PF) 250 MCG/5ML IJ SOLN
INTRAMUSCULAR | Status: AC
Start: 2023-12-19 — End: ?
  Filled 2023-12-19: qty 5

## 2023-12-19 MED ORDER — POVIDONE-IODINE 10 % EX SWAB
2.0000 | Freq: Once | CUTANEOUS | Status: AC
  Administered 2023-12-19: 2 via TOPICAL

## 2023-12-19 MED ORDER — MIDAZOLAM HCL 2 MG/2ML IJ SOLN
INTRAMUSCULAR | Status: DC | PRN
Start: 2023-12-19 — End: 2023-12-19
  Administered 2023-12-19: 2 mg via INTRAVENOUS

## 2023-12-19 MED ORDER — ONDANSETRON HCL 4 MG/2ML IJ SOLN
INTRAMUSCULAR | Status: AC
  Filled 2023-12-19: qty 2

## 2023-12-19 MED ORDER — PROPOFOL 10 MG/ML IV BOLUS
INTRAVENOUS | Status: AC
  Filled 2023-12-19: qty 20

## 2023-12-19 MED ORDER — ONDANSETRON HCL 4 MG/2ML IJ SOLN: 4.0000 mg | Freq: Once | INTRAMUSCULAR | Status: DC | PRN

## 2023-12-19 MED ORDER — LIDOCAINE HCL 1 % IJ SOLN
INTRAMUSCULAR | Status: DC | PRN
  Administered 2023-12-19: 10 mL

## 2023-12-19 MED ORDER — LIDOCAINE 2% (20 MG/ML) 5 ML SYRINGE
INTRAMUSCULAR | Status: DC | PRN
  Administered 2023-12-19: 100 mg via INTRAVENOUS

## 2023-12-19 MED ORDER — LIDOCAINE 2% (20 MG/ML) 5 ML SYRINGE
INTRAMUSCULAR | Status: AC
  Filled 2023-12-19: qty 5

## 2023-12-19 MED ORDER — FENTANYL CITRATE (PF) 250 MCG/5ML IJ SOLN
INTRAMUSCULAR | Status: DC | PRN
  Administered 2023-12-19: 100 ug via INTRAVENOUS
  Administered 2023-12-19: 50 ug via INTRAVENOUS

## 2023-12-19 MED ORDER — CHLORHEXIDINE GLUCONATE 0.12 % MT SOLN
15.0000 mL | Freq: Once | OROMUCOSAL | Status: AC
  Administered 2023-12-19: 15 mL via OROMUCOSAL
  Filled 2023-12-19: qty 15

## 2023-12-19 MED ORDER — ACETAMINOPHEN 500 MG PO TABS: 500.0000 mg | ORAL_TABLET | Freq: Four times a day (QID) | ORAL | 0 refills | Status: AC | PRN

## 2023-12-19 MED ORDER — LEVONORGESTREL 20 MCG/DAY IU IUD
INTRAUTERINE_SYSTEM | INTRAUTERINE | Status: AC
Start: 2023-12-19 — End: ?
  Filled 2023-12-19: qty 1

## 2023-12-19 MED ORDER — LEVONORGESTREL 20 MCG/DAY IU IUD
1.0000 | INTRAUTERINE_SYSTEM | INTRAUTERINE | Status: AC
  Administered 2023-12-19: 1 via INTRAUTERINE
  Filled 2023-12-19: qty 1

## 2023-12-19 MED ORDER — PHENYLEPHRINE HCL-NACL 20-0.9 MG/250ML-% IV SOLN: INTRAVENOUS | Status: DC | PRN

## 2023-12-19 MED ORDER — CELECOXIB 200 MG PO CAPS
400.0000 mg | ORAL_CAPSULE | ORAL | Status: AC
  Administered 2023-12-19: 400 mg via ORAL
  Filled 2023-12-19: qty 2

## 2023-12-19 MED ORDER — ACETAMINOPHEN 10 MG/ML IV SOLN: 1000.0000 mg | Freq: Once | INTRAVENOUS | Status: DC | PRN

## 2023-12-19 MED ORDER — FENTANYL CITRATE (PF) 100 MCG/2ML IJ SOLN: 25.0000 ug | INTRAMUSCULAR | Status: DC | PRN

## 2023-12-19 MED ORDER — MIDAZOLAM HCL 2 MG/2ML IJ SOLN
INTRAMUSCULAR | Status: AC
  Filled 2023-12-19: qty 2

## 2023-12-19 MED ORDER — ACETAMINOPHEN 500 MG PO TABS
1000.0000 mg | ORAL_TABLET | ORAL | Status: AC
  Administered 2023-12-19: 1000 mg via ORAL
  Filled 2023-12-19: qty 2

## 2023-12-19 MED ORDER — IBUPROFEN 600 MG PO TABS: 600.0000 mg | ORAL_TABLET | Freq: Four times a day (QID) | ORAL | 0 refills | Status: DC | PRN

## 2023-12-19 MED ORDER — PHENYLEPHRINE 80 MCG/ML (10ML) SYRINGE FOR IV PUSH (FOR BLOOD PRESSURE SUPPORT)
PREFILLED_SYRINGE | INTRAVENOUS | Status: AC
  Filled 2023-12-19: qty 10

## 2023-12-19 MED ORDER — PHENYLEPHRINE 80 MCG/ML (10ML) SYRINGE FOR IV PUSH (FOR BLOOD PRESSURE SUPPORT)
PREFILLED_SYRINGE | INTRAVENOUS | Status: DC | PRN
  Administered 2023-12-19 (×2): 80 ug via INTRAVENOUS

## 2023-12-19 MED ORDER — LIDOCAINE HCL 1 % IJ SOLN
INTRAMUSCULAR | Status: AC
  Filled 2023-12-19: qty 20

## 2023-12-19 MED ORDER — PROPOFOL 10 MG/ML IV BOLUS
INTRAVENOUS | Status: DC | PRN
Start: 2023-12-19 — End: 2023-12-19
  Administered 2023-12-19: 200 mg via INTRAVENOUS

## 2023-12-19 MED ORDER — DEXAMETHASONE SODIUM PHOSPHATE 10 MG/ML IJ SOLN
INTRAMUSCULAR | Status: AC
  Filled 2023-12-19: qty 1

## 2023-12-19 MED ORDER — EPHEDRINE 5 MG/ML INJ
INTRAVENOUS | Status: AC
  Filled 2023-12-19: qty 5

## 2023-12-19 MED ORDER — ORAL CARE MOUTH RINSE: 15.0000 mL | Freq: Once | OROMUCOSAL | Status: AC

## 2023-12-19 MED ORDER — OXYCODONE HCL 5 MG/5ML PO SOLN: 5.0000 mg | Freq: Once | ORAL | Status: DC | PRN

## 2023-12-19 MED ORDER — LACTATED RINGERS IV SOLN: INTRAVENOUS | Status: DC

## 2023-12-19 MED ORDER — ROCURONIUM BROMIDE 10 MG/ML (PF) SYRINGE
PREFILLED_SYRINGE | INTRAVENOUS | Status: AC
  Filled 2023-12-19: qty 10

## 2023-12-19 MED ORDER — ONDANSETRON HCL 4 MG/2ML IJ SOLN
INTRAMUSCULAR | Status: DC | PRN
  Administered 2023-12-19: 4 mg via INTRAVENOUS

## 2023-12-19 MED ORDER — EPHEDRINE SULFATE-NACL 50-0.9 MG/10ML-% IV SOSY
PREFILLED_SYRINGE | INTRAVENOUS | Status: DC | PRN
  Administered 2023-12-19 (×4): 10 mg via INTRAVENOUS

## 2023-12-19 MED ORDER — DEXAMETHASONE SODIUM PHOSPHATE 10 MG/ML IJ SOLN
INTRAMUSCULAR | Status: DC | PRN
  Administered 2023-12-19: 10 mg via INTRAVENOUS

## 2023-12-19 MED ORDER — OXYCODONE HCL 5 MG PO TABS: 5.0000 mg | ORAL_TABLET | Freq: Once | ORAL | Status: DC | PRN

## 2023-12-19 SURGICAL SUPPLY — 19 items
CATH ROBINSON RED A/P 16FR (CATHETERS) IMPLANT
DEVICE MYOSURE LITE (MISCELLANEOUS) IMPLANT
DEVICE MYOSURE REACH (MISCELLANEOUS) IMPLANT
DILATOR CANAL MILEX (MISCELLANEOUS) IMPLANT
GAUZE 4X4 16PLY ~~LOC~~+RFID DBL (SPONGE) IMPLANT
GLOVE BIOGEL PI IND STRL 6 (GLOVE) ×2 IMPLANT
GLOVE SS PI 5.5 STRL (GLOVE) ×2 IMPLANT
GOWN STRL REUS W/ TWL LRG LVL3 (GOWN DISPOSABLE) ×2 IMPLANT
KIT PROCEDURE FLUENT (KITS) ×2 IMPLANT
KIT TURNOVER CYSTO (KITS) ×2 IMPLANT
PACK VAGINAL MINOR WOMEN LF (CUSTOM PROCEDURE TRAY) ×2 IMPLANT
PAD OB MATERNITY 11 LF (PERSONAL CARE ITEMS) ×2 IMPLANT
PAD PREP 24X48 CUFFED NSTRL (MISCELLANEOUS) ×2 IMPLANT
SEAL CERVICAL OMNI LOK (ABLATOR) IMPLANT
SEAL ROD LENS SCOPE MYOSURE (ABLATOR) ×2 IMPLANT
SLEEVE SCD COMPRESS KNEE MED (STOCKING) ×2 IMPLANT
SOL .9 NS 3000ML IRR UROMATIC (IV SOLUTION) ×2 IMPLANT
SYSTEM TISS REMOVAL MYOSURE XL (MISCELLANEOUS) IMPLANT
TOWEL GREEN STERILE (TOWEL DISPOSABLE) ×2 IMPLANT

## 2023-12-19 NOTE — Discharge Instructions (Signed)

## 2023-12-19 NOTE — Op Note (Signed)
 PREOPERATIVE DIAGNOSES: 1. Thickened endometrial stripe 2. Irregular menstrual cycles  POSTOPERATIVE DIAGNOSES: Same  PROCEDURE PERFORMED: Hysteroscopy, dilation and curettage, polypectomy with Myosure, Mirena IUD insertion  SURGEON: Dr. Cathryn Cobb  ANESTHESIA: General  ESTIMATED BLOOD LOSS: minimal  COMPLICATIONS: None  TUBES: None.  DRAINS: None  PATHOLOGY: Endometrial curettings  FINDINGS: On exam, under anesthesia, normal appearing vulva and vagina. Hysteroscopic eval of endometrium with multiple polyps and fluffy-appearing tissue.  Procedure: The patient was taken to the operating room where she was properly prepped and draped in sterile manner under general anesthesia. After bimanual examination, the cervix was exposed with a speculum and the anterior lip of the cervix grasped with a tenaculum. Paracervical block performed. The endocervical canal was then progressively dilated. The hysteroscope was introduced into the uterus and an assessment of the cavity was performed. Bilateral tubal ostia were visualized. The Myosure Lite device was used to perform polypectomy. A sharp curette was introduced and a curettage was then performed. The hysteroscope was reintroduced for a post-curettage survey. Mirena IUD was placed in standard fashion and strings were trimmed to 2cm. Tenaculum was removed and sites were inspected and found to be hemostatic. All instruments were removed from vagina. The sponge and lap counts were correct times 2 at this time. The patient's procedure was terminated. We then awakened her. She was sent to the Recovery Room in good condition.   Cathryn Cobb, MD

## 2023-12-19 NOTE — Anesthesia Postprocedure Evaluation (Signed)
 Anesthesia Post Note  Patient: Alexandra Newton  Procedure(s) Performed: DILATATION AND CURETTAGE /HYSTEROSCOPY (Uterus) INSERTION, INTRAUTERINE DEVICE (Uterus) MYOSURE RESECTION (Uterus)     Patient location during evaluation: PACU Anesthesia Type: General Level of consciousness: awake and alert Pain management: pain level controlled Vital Signs Assessment: post-procedure vital signs reviewed and stable Respiratory status: spontaneous breathing, nonlabored ventilation, respiratory function stable and patient connected to nasal cannula oxygen Cardiovascular status: blood pressure returned to baseline and stable Postop Assessment: no apparent nausea or vomiting Anesthetic complications: no   No notable events documented.  Last Vitals:  Vitals:   12/19/23 0601 12/19/23 0830  BP: 126/85 (!) 159/72  Pulse: 74 81  Resp: 17 14  Temp: 36.4 C 36.5 C  SpO2: 97% 93%    Last Pain:  Vitals:   12/19/23 0625  TempSrc:   PainSc: 0-No pain                 Leslye Rast

## 2023-12-19 NOTE — Anesthesia Procedure Notes (Signed)
 Procedure Name: LMA Insertion Date/Time: 12/19/2023 7:35 AM  Performed by: Trenton Frock, CRNAPre-anesthesia Checklist: Patient identified, Emergency Drugs available, Suction available, Patient being monitored and Timeout performed Patient Re-evaluated:Patient Re-evaluated prior to induction Oxygen Delivery Method: Circle system utilized Preoxygenation: Pre-oxygenation with 100% oxygen Induction Type: IV induction Ventilation: Mask ventilation without difficulty LMA: LMA inserted LMA Size: 4.0 Grade View: Grade I Tube type: Oral Number of attempts: 1 Airway Equipment and Method: Patient positioned with wedge pillow and Bite block Tube secured with: Tape

## 2023-12-19 NOTE — Interval H&P Note (Signed)
 History and Physical Interval Note:  12/19/2023 7:10 AM  Alexandra Newton  has presented today for surgery, with the diagnosis of IRREGULAR PERIODS THICKENED ENDOMETRIUM IUD INSERTION.  The various methods of treatment have been discussed with the patient and family. After consideration of risks, benefits and other options for treatment, the patient has consented to  Procedure(s) with comments: DILATATION AND CURETTAGE /HYSTEROSCOPY (N/A) - POSSIBLE MYOSURE LITE INSERTION, INTRAUTERINE DEVICE (N/A) - MIRENA as a surgical intervention.  The patient's history has been reviewed, patient examined, no change in status, stable for surgery.  I have reviewed the patient's chart and labs.  Questions were answered to the patient's satisfaction.     [name]

## 2023-12-19 NOTE — Progress Notes (Signed)
Urine pregnancy test: Negative

## 2023-12-19 NOTE — Anesthesia Preprocedure Evaluation (Addendum)
 Anesthesia Evaluation  Patient identified by MRN, date of birth, ID band Patient awake    Reviewed: Allergy & Precautions, NPO status , Patient's Chart, lab work & pertinent test results, reviewed documented beta blocker date and time   History of Anesthesia Complications Negative for: history of anesthetic complications  Airway Mallampati: II  TM Distance: >3 FB Neck ROM: Full    Dental no notable dental hx.    Pulmonary asthma , neg COPD    + decreased breath sounds      Cardiovascular (-) hypertension(-) angina (-) CAD and (-) Past MI  Rhythm:Regular Rate:Normal     Neuro/Psych neg Seizures PSYCHIATRIC DISORDERS Anxiety Depression       GI/Hepatic ,neg GERD  ,,(+) neg Cirrhosis        Endo/Other    Class 4 obesity  Renal/GU Renal disease     Musculoskeletal   Abdominal   Peds  Hematology  (+) Blood dyscrasia, anemia   Anesthesia Other Findings   Reproductive/Obstetrics                             Anesthesia Physical Anesthesia Plan  ASA: 3  Anesthesia Plan: General   Post-op Pain Management:    Induction: Intravenous  PONV Risk Score and Plan: 2 and Ondansetron  and Dexamethasone   Airway Management Planned: LMA  Additional Equipment:   Intra-op Plan:   Post-operative Plan: Extubation in OR  Informed Consent: I have reviewed the patients History and Physical, chart, labs and discussed the procedure including the risks, benefits and alternatives for the proposed anesthesia with the patient or authorized representative who has indicated his/her understanding and acceptance.     Dental advisory given  Plan Discussed with: CRNA  Anesthesia Plan Comments:         Anesthesia Quick Evaluation

## 2023-12-19 NOTE — Transfer of Care (Signed)
 Immediate Anesthesia Transfer of Care Note  Patient: Azhane N Koehn  Procedure(s) Performed: DILATATION AND CURETTAGE /HYSTEROSCOPY (Uterus) INSERTION, INTRAUTERINE DEVICE (Uterus) MYOSURE RESECTION (Uterus)  Patient Location: PACU  Anesthesia Type:General  Level of Consciousness: awake, alert , oriented, and patient cooperative  Airway & Oxygen Therapy: Patient Spontanous Breathing  Post-op Assessment: Report given to RN, Post -op Vital signs reviewed and stable, Patient moving all extremities, and Patient able to stick tongue midline  Post vital signs: Reviewed and stable  Last Vitals:  Vitals Value Taken Time  BP 159/72 12/19/23 0830  Temp 36.5 C 12/19/23 0830  Pulse 65 12/19/23 0835  Resp 23 12/19/23 0835  SpO2 96 % 12/19/23 0835  Vitals shown include unfiled device data.  Last Pain:  Vitals:   12/19/23 0625  TempSrc:   PainSc: 0-No pain      Patients Stated Pain Goal: 0 (12/19/23 0617)  Complications: No notable events documented.

## 2023-12-20 ENCOUNTER — Encounter (HOSPITAL_COMMUNITY): Payer: Self-pay | Admitting: Obstetrics and Gynecology

## 2023-12-20 LAB — TYPE AND SCREEN
ABO/RH(D): A POS
Antibody Screen: NEGATIVE

## 2023-12-20 LAB — SURGICAL PATHOLOGY

## 2023-12-22 ENCOUNTER — Inpatient Hospital Stay: Attending: Oncology

## 2023-12-22 DIAGNOSIS — Z79899 Other long term (current) drug therapy: Secondary | ICD-10-CM | POA: Insufficient documentation

## 2023-12-22 DIAGNOSIS — D508 Other iron deficiency anemias: Secondary | ICD-10-CM | POA: Insufficient documentation

## 2023-12-22 DIAGNOSIS — D509 Iron deficiency anemia, unspecified: Secondary | ICD-10-CM

## 2023-12-22 LAB — CBC WITH DIFFERENTIAL/PLATELET
Abs Immature Granulocytes: 0.02 10*3/uL (ref 0.00–0.07)
Basophils Absolute: 0 10*3/uL (ref 0.0–0.1)
Basophils Relative: 1 %
Eosinophils Absolute: 0.1 10*3/uL (ref 0.0–0.5)
Eosinophils Relative: 1 %
HCT: 37 % (ref 36.0–46.0)
Hemoglobin: 11.6 g/dL — ABNORMAL LOW (ref 12.0–15.0)
Immature Granulocytes: 0 %
Lymphocytes Relative: 24 %
Lymphs Abs: 2 10*3/uL (ref 0.7–4.0)
MCH: 26.8 pg (ref 26.0–34.0)
MCHC: 31.4 g/dL (ref 30.0–36.0)
MCV: 85.5 fL (ref 80.0–100.0)
Monocytes Absolute: 0.5 10*3/uL (ref 0.1–1.0)
Monocytes Relative: 6 %
Neutro Abs: 5.6 10*3/uL (ref 1.7–7.7)
Neutrophils Relative %: 68 %
Platelets: 261 10*3/uL (ref 150–400)
RBC: 4.33 MIL/uL (ref 3.87–5.11)
RDW: 15 % (ref 11.5–15.5)
WBC: 8.2 10*3/uL (ref 4.0–10.5)
nRBC: 0 % (ref 0.0–0.2)

## 2023-12-22 LAB — COMPREHENSIVE METABOLIC PANEL WITH GFR
ALT: 29 U/L (ref 0–44)
AST: 16 U/L (ref 15–41)
Albumin: 3.5 g/dL (ref 3.5–5.0)
Alkaline Phosphatase: 72 U/L (ref 38–126)
Anion gap: 9 (ref 5–15)
BUN: 12 mg/dL (ref 6–20)
CO2: 25 mmol/L (ref 22–32)
Calcium: 9.1 mg/dL (ref 8.9–10.3)
Chloride: 101 mmol/L (ref 98–111)
Creatinine, Ser: 0.48 mg/dL (ref 0.44–1.00)
GFR, Estimated: >60 mL/min (ref >60)
Glucose, Bld: 99 mg/dL (ref 70–99)
Potassium: 4 mmol/L (ref 3.5–5.1)
Sodium: 135 mmol/L (ref 135–145)
Total Bilirubin: 0.7 mg/dL (ref 0.0–1.2)
Total Protein: 6.8 g/dL (ref 6.5–8.1)

## 2023-12-22 LAB — FOLATE: Folate: 4.7 ng/mL — ABNORMAL LOW (ref >5.9)

## 2023-12-22 LAB — IRON AND TIBC
Iron: 81 ug/dL (ref 28–170)
Saturation Ratios: 28 % (ref 10.4–31.8)
TIBC: 293 ug/dL (ref 250–450)
UIBC: 212 ug/dL

## 2023-12-22 LAB — VITAMIN B12: Vitamin B-12: 202 pg/mL (ref 180–914)

## 2023-12-22 LAB — FERRITIN: Ferritin: 58 ng/mL (ref 11–307)

## 2023-12-28 ENCOUNTER — Inpatient Hospital Stay: Admitting: Oncology

## 2024-02-07 ENCOUNTER — Other Ambulatory Visit: Payer: Self-pay | Admitting: Obstetrics and Gynecology

## 2024-02-07 ENCOUNTER — Telehealth: Payer: Self-pay

## 2024-02-07 DIAGNOSIS — D509 Iron deficiency anemia, unspecified: Secondary | ICD-10-CM | POA: Insufficient documentation

## 2024-02-07 DIAGNOSIS — N939 Abnormal uterine and vaginal bleeding, unspecified: Secondary | ICD-10-CM | POA: Insufficient documentation

## 2024-02-07 NOTE — Telephone Encounter (Signed)
 Auth Submission: NO AUTH NEEDED Site of care: Site of care: [facility] Payer: [employer] state health plan Medication & CPT/J Code(s) submitted: Venofer  (Iron  Sucrose) J1756 Diagnosis Code:  Route of submission (phone, fax, portal): Phone # Fax # Auth type: Buy/Bill PB Units/visits requested: 300mg  x 3 doses Reference number:  Approval from: 02/07/24 to 06/08/24

## 2024-03-27 ENCOUNTER — Encounter: Payer: Self-pay | Admitting: Emergency Medicine

## 2024-03-27 ENCOUNTER — Ambulatory Visit: Admission: EM | Admit: 2024-03-27 | Discharge: 2024-03-27 | Disposition: A | Discharge status: Home / Self Care

## 2024-03-27 ENCOUNTER — Observation Stay (HOSPITAL_COMMUNITY)
Admission: EM | Admit: 2024-03-27 | Discharge: 2024-03-28 | Disposition: A | Discharge status: Home / Self Care | Attending: Family Medicine | Admitting: Family Medicine

## 2024-03-27 ENCOUNTER — Emergency Department (HOSPITAL_COMMUNITY)

## 2024-03-27 ENCOUNTER — Encounter (HOSPITAL_COMMUNITY): Payer: Self-pay | Admitting: *Deleted

## 2024-03-27 ENCOUNTER — Other Ambulatory Visit: Payer: Self-pay

## 2024-03-27 DIAGNOSIS — D509 Iron deficiency anemia, unspecified: Secondary | ICD-10-CM | POA: Diagnosis not present

## 2024-03-27 DIAGNOSIS — D649 Anemia, unspecified: Principal | ICD-10-CM | POA: Diagnosis present

## 2024-03-27 DIAGNOSIS — R0602 Shortness of breath: Secondary | ICD-10-CM

## 2024-03-27 DIAGNOSIS — N939 Abnormal uterine and vaginal bleeding, unspecified: Secondary | ICD-10-CM | POA: Diagnosis not present

## 2024-03-27 DIAGNOSIS — M7989 Other specified soft tissue disorders: Secondary | ICD-10-CM

## 2024-03-27 DIAGNOSIS — R600 Localized edema: Secondary | ICD-10-CM | POA: Diagnosis present

## 2024-03-27 DIAGNOSIS — Z6841 Body Mass Index (BMI) 40.0 and over, adult: Secondary | ICD-10-CM | POA: Insufficient documentation

## 2024-03-27 DIAGNOSIS — M79662 Pain in left lower leg: Secondary | ICD-10-CM

## 2024-03-27 LAB — CBC
HCT: 21.5 % — ABNORMAL LOW (ref 36.0–46.0)
Hemoglobin: 6 g/dL — CL (ref 12.0–15.0)
MCH: 22.6 pg — ABNORMAL LOW (ref 26.0–34.0)
MCHC: 27.9 g/dL — ABNORMAL LOW (ref 30.0–36.0)
MCV: 81.1 fL (ref 80.0–100.0)
Platelets: 454 K/uL — ABNORMAL HIGH (ref 150–400)
RBC: 2.65 MIL/uL — ABNORMAL LOW (ref 3.87–5.11)
RDW: 17.5 % — ABNORMAL HIGH (ref 11.5–15.5)
WBC: 9.3 K/uL (ref 4.0–10.5)
nRBC: 0.2 % (ref 0.0–0.2)

## 2024-03-27 LAB — COMPREHENSIVE METABOLIC PANEL WITH GFR
ALT: 20 U/L (ref 0–44)
AST: 14 U/L — ABNORMAL LOW (ref 15–41)
Albumin: 3.4 g/dL — ABNORMAL LOW (ref 3.5–5.0)
Alkaline Phosphatase: 60 U/L (ref 38–126)
Anion gap: 9 (ref 5–15)
BUN: 8 mg/dL (ref 6–20)
CO2: 21 mmol/L — ABNORMAL LOW (ref 22–32)
Calcium: 9 mg/dL (ref 8.9–10.3)
Chloride: 108 mmol/L (ref 98–111)
Creatinine, Ser: 0.62 mg/dL (ref 0.44–1.00)
GFR, Estimated: >60 mL/min (ref >60)
Glucose, Bld: 112 mg/dL — ABNORMAL HIGH (ref 70–99)
Potassium: 3.6 mmol/L (ref 3.5–5.1)
Sodium: 138 mmol/L (ref 135–145)
Total Bilirubin: 0.5 mg/dL (ref 0.0–1.2)
Total Protein: 7 g/dL (ref 6.5–8.1)

## 2024-03-27 LAB — TROPONIN I (HIGH SENSITIVITY)
Troponin I (High Sensitivity): 3 ng/L (ref <18)
Troponin I (High Sensitivity): 3 ng/L (ref <18)

## 2024-03-27 LAB — D-DIMER, QUANTITATIVE: D-Dimer, Quant: 2.95 ug{FEU}/mL — ABNORMAL HIGH (ref 0.00–0.50)

## 2024-03-27 LAB — PREPARE RBC (CROSSMATCH)

## 2024-03-27 LAB — HCG, SERUM, QUALITATIVE: Preg, Serum: NEGATIVE

## 2024-03-27 MED ORDER — ACETAMINOPHEN 500 MG PO TABS
1000.0000 mg | ORAL_TABLET | Freq: Once | ORAL | Status: AC
  Administered 2024-03-27 (×2): 1000 mg via ORAL
  Filled 2024-03-27: qty 2

## 2024-03-27 MED ORDER — MEGESTROL ACETATE 20 MG PO TABS
20.0000 mg | ORAL_TABLET | Freq: Two times a day (BID) | ORAL | Status: DC
  Administered 2024-03-27 – 2024-03-28 (×3): 20 mg via ORAL
  Filled 2024-03-27 (×3): qty 1

## 2024-03-27 MED ORDER — SODIUM CHLORIDE 0.9% IV SOLUTION: Freq: Once | INTRAVENOUS | Status: DC

## 2024-03-27 MED ORDER — METHOCARBAMOL 500 MG PO TABS
1000.0000 mg | ORAL_TABLET | Freq: Once | ORAL | Status: AC
  Administered 2024-03-27 (×2): 1000 mg via ORAL
  Filled 2024-03-27: qty 2

## 2024-03-27 NOTE — ED Provider Notes (Signed)
 [place] EMERGENCY DEPARTMENT AT [facility] Provider Note   CSN: [id number] Arrival date & time: 03/27/24  1700     Patient presents with: Leg Swelling   Alexandra Newton is a 33 y.o. female with past medical history of uterine bleeding, anemia presenting to emergency room with complaint of left calf pain.  She reports this started this morning and it is located in the back of her left calf worse with walking or stretching.  Has tried over-the-counter medications without any relief. Patient also notes that she has had very heavy vaginal bleeding for approximately 3 months.  Over the last 1 week she continues to have vaginal bleeding but it has improved after starting Gallifrey / progesterone.  She does note that she has to take it easy because she is fatigued and will occasionally feel like she will pass out and that her heart was racing with any kind of exertion or activity.  Notes that she has had some improvement since bleeding has improved.  She denies any melena or hematochezia.  Currently denies shortness of breath, chest pain, cough.   HPI     Prior to Admission medications   Medication Sig Start Date End Date Taking? Authorizing Provider  acetaminophen  (TYLENOL ) 500 MG tablet Take 1 tablet (500 mg total) by mouth every 6 (six) hours as needed. 12/19/23   Laurence Slater PARAS, MD  ALPRAZolam  (XANAX ) 0.25 MG tablet Take 0.25 mg by mouth 2 (two) times daily as needed for anxiety. 10/02/23   [provider]  busPIRone  (BUSPAR ) 7.5 MG tablet Take 1 tablet (7.5 mg total) by mouth 2 (two) times daily. For anxiety Patient taking differently: Take 3.75-7.5 mg by mouth See admin instructions. Take 7.5 mg in the morning and 3.75 to 7.5 mg in the evening 09/23/20   Bari Theodoro FALCON, MD  ferrous sulfate  325 (65 FE) MG EC tablet Take 1 tablet (325 mg total) by mouth every other day. 11/14/23   Kandala, Hyndavi, MD  ibuprofen  (ADVIL ) 600 MG tablet Take 1 tablet (600 mg total) by mouth every 6  (six) hours as needed. 12/19/23   Laurence Slater PARAS, MD  Multiple Vitamins-Minerals (AIRBORNE ELDERBERRY) CHEW Chew 1 capsule by mouth every other day.    [provider]  phentermine (ADIPEX-P) 37.5 MG tablet Take 18.75-37.5 mg by mouth See admin instructions. Take 37.5 mg in the morning and 18.75 mg in the afternoon 11/03/23   [provider]  Vitamin D, Ergocalciferol, (DRISDOL) 1.25 MG (50000 UNIT) CAPS capsule Take 50,000 Units by mouth every Sunday. 09/10/23   [provider]    Allergies: Chocolate and Pork-derived products    Review of Systems  Cardiovascular:  Positive for leg swelling.  Genitourinary:  Positive for vaginal bleeding.    Updated Vital Signs BP 136/82   Pulse (!) 102   Temp 99 F (37.2 C)   Resp 18   Ht 5' 7 (1.702 m)   Wt (!) 158.3 kg   LMP 12/19/2023 (Exact Date) Comment: has not had period since GYN procedure  SpO2 100%   BMI 54.66 kg/m   Physical Exam Vitals and nursing note reviewed.  Constitutional:      General: She is not in acute distress.    Appearance: She is not toxic-appearing.  HENT:     Head: Normocephalic and atraumatic.  Eyes:     General: No scleral icterus.    Conjunctiva/sclera: Conjunctivae normal.  Cardiovascular:     Rate and Rhythm: Normal rate  and regular rhythm.     Pulses: Normal pulses.     Heart sounds: Normal heart sounds.  Pulmonary:     Effort: Pulmonary effort is normal. No respiratory distress.     Breath sounds: Normal breath sounds.  Abdominal:     General: Abdomen is flat. Bowel sounds are normal.     Palpations: Abdomen is soft.     Tenderness: There is no abdominal tenderness.  Musculoskeletal:     Right lower leg: No edema.     Left lower leg: No edema.     Comments: No observable lower extremity swelling.  Positive Homans' sign on left calf.  Strong dorsal pedal pulse equal bilaterally.  Skin:    General: Skin is warm and dry.     Findings: No lesion.  Neurological:     General:  No focal deficit present.     Mental Status: She is alert and oriented to person, place, and time. Mental status is at baseline.     (all labs ordered are listed, but only abnormal results are displayed) Labs Reviewed  CBC - Abnormal; Notable for the following components:      Result Value   RBC 2.65 (*)    Hemoglobin 6.0 (*)    HCT 21.5 (*)    MCH 22.6 (*)    MCHC 27.9 (*)    RDW 17.5 (*)    Platelets 454 (*)    All other components within normal limits  COMPREHENSIVE METABOLIC PANEL WITH GFR - Abnormal; Notable for the following components:   CO2 21 (*)    Glucose, Bld 112 (*)    Albumin 3.4 (*)    AST 14 (*)    All other components within normal limits  HCG, SERUM, QUALITATIVE  TROPONIN I (HIGH SENSITIVITY)  TROPONIN I (HIGH SENSITIVITY)    EKG: EKG Interpretation Date/Time:  Wednesday March 27 2024 17:14:12 EDT Ventricular Rate:  105 PR Interval:  152 QRS Duration:  76 QT Interval:  316 QTC Calculation: 417 R Axis:   40  Text Interpretation: Sinus tachycardia Otherwise normal ECG When compared with ECG of 20-Sep-2023 00:34, PREVIOUS ECG IS PRESENT Interpretation limited secondary to artifact no stemi Confirmed by Elnor Savant ([id number]) on 03/27/2024 7:22:12 PM  Radiology: DG Chest 2 View Result Date: 03/27/2024 CLINICAL DATA:  Shortness of breath. EXAM: CHEST - 2 VIEW COMPARISON:  Radiograph and CT 09/20/2023 FINDINGS: The cardiomediastinal contours are normal. The lungs are clear. Pulmonary vasculature is normal. No consolidation, pleural effusion, or pneumothorax. No acute osseous abnormalities are seen. IMPRESSION: No active cardiopulmonary disease. Electronically Signed   By: Andrea Gasman M.D.   On: 03/27/2024 20:53     .Critical Care  Performed by: Shermon Warren SAILOR, PA-C Authorized by: Shermon Warren SAILOR, PA-C   Critical care provider statement:    Critical care time (minutes):  30   Critical care was necessary to treat or prevent imminent or  life-threatening deterioration of the following conditions:  Circulatory failure   Critical care was time spent personally by me on the following activities:  Development of treatment plan with patient or surrogate, discussions with consultants, evaluation of patient's response to treatment, examination of patient, ordering and review of laboratory studies, ordering and review of radiographic studies, ordering and performing treatments and interventions, pulse oximetry, re-evaluation of patient's condition and review of old charts    Medications Ordered in the ED - No data to display  Clinical Course as of 03/27/24 2320  Wed Mar 27, 2024  2200 Spoke with Dr Mat SHIPPER. Recommending Megace , watch hbg. While vaginal bleeding no need for repeat vag US . Sonohystogram OP at F/u.  [JB]    Clinical Course User Index [JB] Evelyna Folker, Warren SAILOR, PA-C                                 Medical Decision Making Amount and/or Complexity of Data Reviewed Labs: ordered. Radiology: ordered.  Risk OTC drugs. Prescription drug management. Decision regarding hospitalization.   This patient presents to the ED for concern of calf pain, this involves an extensive number of treatment options, and is a complaint that carries with it a high risk of complications and morbidity.  The differential diagnosis includes DVT, cellulitis, peripheral artery disease   Co morbidities that complicate the patient evaluation  Anemia, heavy vaginal bleeding  Additional history obtained:  Additional history obtained from last oncology visit which was 11/14/2023 in which patient's hemoglobin was 10.6  Lab Tests:  I personally interpreted labs.  The pertinent results include:   No leukocytosis.  Hemoglobin is 6 which is significantly decreased since prior recent labs most recently being 3 months ago (11.6) D-dimer elevated at 2.95 --ordering PE study and DVT study will be done tomorrow.   Imaging Studies ordered:  I  ordered imaging studies including chest x-ray, DVT study I independently visualized and interpreted imaging which showed no acute findings on chest x-ray.   I agree with the radiologist interpretation   Cardiac Monitoring: / EKG:  The patient was maintained on a cardiac monitor.  I personally viewed and interpreted the cardiac monitored which showed an underlying rhythm of: Sinus tachycardia   Consultations Obtained:  I requested consultation with the OBGYN,  and discussed lab and imaging findings as well as pertinent plan - they recommend: Megace  20 mg p.o. twice daily.  No utility for getting pelvic ultrasound done today.  Will need sonohysterogram outpatient. Spoke with hospitalist who agrees to admit.   Problem List / ED Course / Critical interventions / Medication management  Patient presenting to emergency room with complaint of left calf pain, getting left DVT study to further evaluate and rule out DVT she has no history of DVT or PE.  No recent surgeries.  She was recently started on a progesterone.  No recent travel.  She denies any chest pain or shortness of breath.  Upon checking labs patient's hemoglobin found to be 6.  When further discussing she reports she has been having near syncopal episodes and severe fatigue in association to heavy vaginal bleeding over the past 2 months.  She continues to have vaginal bleeding although less after starting progesterone.  Feels since patient is having symptomatic anemia with continued bleeding she needs blood transfusion which she is agreeable to here.  She will stay for blood transfusion and have hemoglobin rechecked.  Will need to follow-up with OB/GYN outpatient.  Since D-dimer is elevated ordered PE study and DVT study.      Final diagnoses:  Anemia, unspecified type  Vaginal bleeding  Pain of left calf    ED Discharge Orders     None          Shermon Warren SAILOR DEVONNA 03/27/24 2322    Albertina Dixon, MD 03/28/24  0145

## 2024-03-27 NOTE — ED Triage Notes (Signed)
 Ed pa noitified of low hgb.

## 2024-03-27 NOTE — Assessment & Plan Note (Signed)
 Given elevated D-dimer will order Doppler

## 2024-03-27 NOTE — Discharge Instructions (Addendum)
 We recommend you go to the emergency department for further evaluation.  We do not recommend that you drive yourself for safety reasons.

## 2024-03-27 NOTE — H&P (Signed)
 Alexandra Newton FMW:[id number] DOB: February 04, 1991 DOA: 03/27/2024     PCP: Waylan Almarie SAUNDERS, MD     Patient coming from:    home Lives alone,      Chief Complaint:   Chief Complaint  Patient presents with   Leg Swelling    HPI: Alexandra Newton is a 33 y.o. female with medical history significant of anemia anxiety obesity    Presented with   shortness of breath Pt had heavy vaginal bleeding for the past 3 months For the past few weeks a has been passing blood clots  OB re megace  20 mg PO bid Hg down to 6.0  She has had left leg cramping d.dimer 2.9  Reports left lower extremity pain and swelling for the past 24 hours Endorses decreased ambulation Denies significant ETOH intake   Does not smoke        Chronic anemia - baseline hg Hemoglobin & Hematocrit  Recent Labs    12/19/23 0600 12/22/23 0902 03/27/24 1802  HGB 11.8* 11.6* 6.0*   Iron /TIBC/Ferritin/ %Sat    Component Value Date/Time   IRON  81 12/22/2023 0902   TIBC 293 12/22/2023 0902   FERRITIN 58 12/22/2023 0902   IRONPCTSAT 28 12/22/2023 0902   IRONPCTSAT 6 (L) 05/22/2017 1141     While in ER: Clinical Course as of 03/27/24 2328  Wed Mar 27, 2024  2200 Spoke with Dr Mat OBGYN. Recommending Megace , watch hbg. While vaginal bleeding no need for repeat vag US . Sonohystogram OP at F/u.  [JB]    Clinical Course User Index [JB] Barrett, Warren SAILOR, PA-C         Lab Orders         CBC         hCG, serum, qualitative         Comprehensive metabolic panel         D-dimer, quantitative      CXR -  NON acute    Following Medications were ordered in ER: Medications  0.9 %  sodium chloride  infusion (Manually program via Guardrails IV Fluids) (0 mLs Intravenous Hold 03/27/24 2226)  megestrol  (MEGACE ) tablet 20 mg (20 mg Oral Given 03/27/24 2241)  methocarbamol  (ROBAXIN ) tablet 1,000 mg (1,000 mg Oral Given 03/27/24 2241)  acetaminophen  (TYLENOL ) tablet 1,000 mg (1,000 mg Oral Given 03/27/24 2241)     _______________________________________________________ ER Provider Called:    OB/GYN They Recommend admit to medicine   Started on Megace      ED Triage Vitals  Encounter Vitals Group     BP 03/27/24 1705 136/82     Girls Systolic BP Percentile --      Girls Diastolic BP Percentile --      Boys Systolic BP Percentile --      Boys Diastolic BP Percentile --      Pulse Rate 03/27/24 1705 (!) 102     Resp 03/27/24 1705 18     Temp 03/27/24 1705 99 F (37.2 C)     Temp Source 03/27/24 2157 Oral     SpO2 03/27/24 1705 100 %     Weight 03/27/24 1733 (!) 348 lb 15.8 oz (158.3 kg)     Height 03/27/24 1733 5' 7 (1.702 m)     Head Circumference --      Peak Flow --      Pain Score 03/27/24 1733 9     Pain Loc --      Pain Education --  Exclude from Growth Chart --   UFJK(75)@     _________________________________________ Significant initial  Findings: Abnormal Labs Reviewed  CBC - Abnormal; Notable for the following components:      Result Value   RBC 2.65 (*)    Hemoglobin 6.0 (*)    HCT 21.5 (*)    MCH 22.6 (*)    MCHC 27.9 (*)    RDW 17.5 (*)    Platelets 454 (*)    All other components within normal limits  COMPREHENSIVE METABOLIC PANEL WITH GFR - Abnormal; Notable for the following components:   CO2 21 (*)    Glucose, Bld 112 (*)    Albumin 3.4 (*)    AST 14 (*)    All other components within normal limits  D-DIMER, QUANTITATIVE - Abnormal; Notable for the following components:   D-Dimer, Quant 2.95 (*)    All other components within normal limits      _________________________ Troponin  ordered Cardiac Panel (last 3 results) Recent Labs    03/27/24 1802 03/27/24 2024  TROPONINIHS 3 3     ECG: Ordered Personally reviewed and interpreted by me showing: HR : 105 Rhythm: Sinus tachycardia Otherwise normal ECG QTC 417     Recent Labs    03/27/24 2143  DDIMER 2.95*     The recent clinical data is shown below. Vitals:   03/27/24 2049  03/27/24 2157 03/27/24 2220 03/27/24 2237  BP: (!) 105/56  (!) 106/53 (!) 101/53  Pulse: 100  (!) 103 97  Resp: (!) 26  (!) 26 (!) 22  Temp:  98.7 F (37.1 C) 98.7 F (37.1 C) 99.9 F (37.7 C)  TempSrc:  Oral Oral Oral  SpO2: 100%  100% 100%  Weight:      Height:        WBC     Component Value Date/Time   WBC 9.3 03/27/2024 1802   LYMPHSABS 2.0 12/22/2023 0902   MONOABS 0.5 12/22/2023 0902   EOSABS 0.1 12/22/2023 0902   BASOSABS 0.0 12/22/2023 0902        __________________________________________________________ Recent Labs  Lab 03/27/24 1802  NA 138  K 3.6  CO2 21*  GLUCOSE 112*  BUN 8  CREATININE 0.62  CALCIUM 9.0    Cr   stable,  Lab Results  Component Value Date   CREATININE 0.62 03/27/2024   CREATININE 0.48 12/22/2023   CREATININE 0.48 09/20/2023    Recent Labs  Lab 03/27/24 1802  AST 14*  ALT 20  ALKPHOS 60  BILITOT 0.5  PROT 7.0  ALBUMIN 3.4*   Lab Results  Component Value Date   CALCIUM 9.0 03/27/2024    Plt: Lab Results  Component Value Date   PLT 454 (H) 03/27/2024    Recent Labs  Lab 03/27/24 1802  WBC 9.3  HGB 6.0*  HCT 21.5*  MCV 81.1  PLT 454*    HG/HCT    Down  from baseline see below    Component Value Date/Time   HGB 6.0 (LL) 03/27/2024 1802   HCT 21.5 (L) 03/27/2024 1802   MCV 81.1 03/27/2024 1802     _______________________________________________ Hospitalist was called for admission for   Anemia  Vaginal bleeding    Pain of left calf     The following Work up has been ordered so far:  Orders Placed This Encounter  Procedures   Critical Care   DG Chest 2 View   CT Angio Chest PE W and/or Wo Contrast   CBC  hCG, serum, qualitative   Comprehensive metabolic panel   D-dimer, quantitative   Informed Consent Details: Physician/Practitioner Attestation; Transcribe to consent form and obtain patient signature   Cardiac Monitoring Continuous x 24 hours Indications for use: Other; Other indications for  use: anemia   Full code   Consult to obstetrics / gynecology  Physician for womens, Dr Laurence group   Consult for [facility] Admission   EKG 12-Lead   Type and screen [facility]   Prepare RBC (crossmatch)   Place in observation (patient's expected length of stay will be less than 2 midnights)   VAS US  LOWER EXTREMITY VENOUS (DVT) (7a-7p)     OTHER Significant initial  Findings:  labs showing:     DM  labs:  HbA1C: No results for input(s): HGBA1C in the last 8760 hours.     CBG (last 3)  No results for input(s): GLUCAP in the last 72 hours.        Cultures: No results found for: SDES, SPECREQUEST, CULT, REPTSTATUS   Radiological Exams on Admission: DG Chest 2 View Result Date: 03/27/2024 CLINICAL DATA:  Shortness of breath. EXAM: CHEST - 2 VIEW COMPARISON:  Radiograph and CT 09/20/2023 FINDINGS: The cardiomediastinal contours are normal. The lungs are clear. Pulmonary vasculature is normal. No consolidation, pleural effusion, or pneumothorax. No acute osseous abnormalities are seen. IMPRESSION: No active cardiopulmonary disease. Electronically Signed   By: Andrea Gasman M.D.   On: 03/27/2024 20:53   _______________________________________________________________________________________________________ Latest  Blood pressure (!) 101/53, pulse 97, temperature 99.9 F (37.7 C), temperature source Oral, resp. rate (!) 22, height 5' 7 (1.702 m), weight (!) 158.3 kg, last menstrual period 12/19/2023, SpO2 100%.   Vitals  labs and radiology finding personally reviewed  Review of Systems:    Pertinent positives include:   shortness of breath at rest. No dyspnea on exertion,   Constitutional:  No weight loss, night sweats, Fevers, chills, fatigue, weight loss  HEENT:  No headaches, Difficulty swallowing,Tooth/dental problems,Sore throat,  No sneezing, itching, ear ache, nasal congestion, post nasal drip,  Cardio-vascular:  No chest pain,  Orthopnea, PND, anasarca, dizziness, palpitations.no Bilateral lower extremity swelling  GI:  No heartburn, indigestion, abdominal pain, nausea, vomiting, diarrhea, change in bowel habits, loss of appetite, melena, blood in stool, hematemesis Resp:  noNo excess mucus, no productive cough, No non-productive cough, No coughing up of blood.No change in color of mucus.No wheezing. Skin:  no rash or lesions. No jaundice GU:  no dysuria, change in color of urine, no urgency or frequency. No straining to urinate.  No flank pain.  Musculoskeletal:  No joint pain or no joint swelling. No decreased range of motion. No back pain.  Psych:  No change in mood or affect. No depression or anxiety. No memory loss.  Neuro: no localizing neurological complaints, no tingling, no weakness, no double vision, no gait abnormality, no slurred speech, no confusion  All systems reviewed and apart from HOPI all are negative _______________________________________________________________________________________________ Past Medical History:   Past Medical History:  Diagnosis Date   Allergy    seasonal   Anxiety    Anxiety    Borderline anemia    pt had 2 iron  infusions in March/April 2025   Depression    Depression    Phreesia 10/11/2020   History of bronchitis as a child    Panic attacks       Past Surgical History:  Procedure Laterality Date   HYSTEROSCOPY WITH D & C N/A  12/19/2023   Procedure: DILATATION AND CURETTAGE /HYSTEROSCOPY;  Surgeon: Laurence Slater PARAS, MD;  Location: [facility] OR;  Service: Gynecology;  Laterality: N/A;  POSSIBLE MYOSURE LITE   INTRAUTERINE DEVICE (IUD) INSERTION N/A 12/19/2023   Procedure: INSERTION, INTRAUTERINE DEVICE;  Surgeon: Laurence Slater PARAS, MD;  Location: [facility] OR;  Service: Gynecology;  Laterality: N/A;  MIRENA    LAPAROSCOPIC GASTRIC SLEEVE RESECTION N/A 02/22/2016   Procedure: LAPAROSCOPIC GASTRIC SLEEVE RESECTION WITH UPPER ENDO;  Surgeon: Morene Olives, MD;  Location: [facility] ORS;   Service: General;  Laterality: N/AMERL NAIL RESECTION  12/19/2023   Procedure: NAIL RESECTION;  Surgeon: Laurence Slater PARAS, MD;  Location: [facility] OR;  Service: Gynecology;;   WISDOM TOOTH EXTRACTION     4    Social History:  Ambulatory   independently      reports that she has never smoked. She has never used smokeless tobacco. She reports current alcohol use. She reports that she does not use drugs.     Family History:   Family History  Problem Relation Age of Onset   Arthritis Mother    Cancer Mother        ovarian   Depression Mother    Diabetes Mother    Hypertension Mother    Obesity Mother    Alcohol abuse Father    Arthritis Father    Depression Father    Arthritis Sister    Depression Sister    Early death Maternal Grandmother    Obesity Maternal Grandmother    Cancer Maternal Grandmother        Lung    Cancer Maternal Grandfather        prostate   Diabetes Maternal Grandfather    Hypertension Maternal Grandfather    Arthritis Paternal Grandmother    Depression Paternal Grandmother    Diabetes Paternal Grandmother    Hypertension Paternal Grandmother    Hyperlipidemia Paternal Grandfather    Stroke Paternal Grandfather    Cancer Maternal Aunt        breast   Depression Maternal Aunt    ______________________________________________________________________________________________ Allergies: Allergies  Allergen Reactions   Chocolate     Inflammation    Pork-Derived Products     Inflammation      Prior to Admission medications   Medication Sig Start Date End Date Taking? Authorizing Provider  acetaminophen  (TYLENOL ) 500 MG tablet Take 1 tablet (500 mg total) by mouth every 6 (six) hours as needed. Patient taking differently: Take 500 mg by mouth every 6 (six) hours as needed for mild pain (pain score 1-3). 12/19/23  Yes Laurence Slater PARAS, MD  ALPRAZolam (XANAX) 0.25 MG tablet Take 0.25 mg by mouth 2 (two) times daily as needed for anxiety. 10/02/23  Yes  [provider]  busPIRone  (BUSPAR ) 7.5 MG tablet Take 1 tablet (7.5 mg total) by mouth 2 (two) times daily. For anxiety Patient taking differently: Take 3.75 mg by mouth in the morning and at bedtime. 09/23/20  Yes Prosperity, Theodoro FALCON, MD  Cholecalciferol (VITAMIN D3) 125 MCG (5000 UT) TABS Take 5,000 Units by mouth every evening.   Yes [provider]  ferrous sulfate  325 (65 FE) MG EC tablet Take 1 tablet (325 mg total) by mouth every other day. 11/14/23  Yes Davonna Siad, MD  GALLIFREY 5 MG tablet Take 5 mg by mouth daily. 02/06/24  Yes [provider]  Multiple Vitamins-Minerals (AIRBORNE ELDERBERRY) CHEW Chew 1 capsule by mouth every other day.   Yes [provider]  phentermine (ADIPEX-P) 37.5 MG tablet Take 18.75-37.5 mg by mouth See admin instructions. Take 37.5 mg in the morning and 18.75 mg in the afternoon Patient not taking: Reported on 03/27/2024 11/03/23   [provider]    ___________________________________________________________________________________________________ Physical Exam:    03/27/2024   10:37 PM 03/27/2024   10:20 PM 03/27/2024    8:49 PM  Vitals with BMI  Systolic 101 106 894  Diastolic 53 53 56  Pulse 97 103 100     1. General:  in No  Acute distress   well   -appearing 2. Psychological: Alert and   Oriented 3. Head/ENT:   Moist  Mucous Membranes                          Head Non traumatic, neck supple                          Normal  Dentition 4. SKIN: normal   Skin turgor,  Skin clean Dry and intact no rash    5. Heart: Regular rate and rhythm no  Murmur, no Rub or gallop 6. Lungs:  no wheezes or crackles   7. Abdomen: Soft,   non-tender, Non distended   obese  bowel sounds present 8. Lower extremities: no clubbing, cyanosis,L >R edema 9. Neurologically Grossly intact, moving all 4 extremities equally   10. MSK: Normal range of motion    Chart has been  reviewed  ______________________________________________________________________________________________  Assessment/Plan 33 y.o. female with medical history significant of anemia anxiety obesity    Admitted for   symptomatic anemia    Present on Admission:  Symptomatic anemia  IDA (iron  deficiency anemia)  Morbid obesity (HCC)  Leg edema, left     IDA (iron  deficiency anemia) Obtain anemia panel  Transfuse for Hg <7   Follow cbc Appreciate OB/GYN recommendations started on Megace   Morbid obesity (HCC) Contributing to comorbidity and complicating medical management  Body mass index is 54.66 kg/m.  Nutritional follow up as an out pt would be recommended   Leg edema, left Given elevated D-dimer will order Doppler   Other plan as per orders.  DVT prophylaxis:  SCD      Code Status:    Code Status: Full Code FULL CODE  as per patient   I had personally discussed CODE STATUS with patient    ACP   none   Family Communication:   Family not at  Bedside    Diet regular   Disposition Plan:   To home once workup is complete and patient is stable   Following barriers for discharge:                                                        Anemia corrected h/H stable                           Consults called: ER provider discussed with OB/GYN   Admission status:  ED Disposition     ED Disposition  Admit   Condition  --   Comment  Hospital Area: [facility] [[id number]]  Level of Care: Telemetry Medical [104]  I expect the patient will be discharged within 24 hours:  Yes  Inclusion criteria 2W: chest pain (low risk and no acute EKG changes), syncope < 65 yrs old, TIA (Asymptomatic), anemia requiring blood transfusion (except GI Bleed), minor fluid/electrolyte imbalances, low risk PE (stable/No O2 requirement): Meets criteria for [facility]-2W observation unit  Covid Evaluation: Asymptomatic - no recent exposure (last 10 days) testing not required  Diagnosis:  Symptomatic anemia [8671310]  Admitting Physician: Tavin Vernet [[id number]]  Attending Physician: Pieper Kasik [[id number]]  For patients discharging to extended facilities (i.e. SNF, AL, group homes or LTAC) initiate:: Discharge to SNF/Facility Placement COVID-19 Lab Testing Protocol          Obs     Level of care     tele  For  24H     Candance Bohlman 03/27/2024, 11:35 PM    [facility]   after 2 AM please page floor coverage   If 7AM-7PM, please contact the day team taking care of the patient using [email or web address]

## 2024-03-27 NOTE — ED Provider Triage Note (Signed)
 Emergency Medicine Provider Triage Evaluation Note  Alexandra Newton , a 33 y.o. female  was evaluated in triage.  Pt complains of woke up with leg cram at 4am (left leg).  No recent surgeries, hospitalization, long travel, hemoptysis, estrogen containing OCP, cancer history.  No unilateral leg swelling.  No history of PE or VTE.   Review of Systems  Positive: SOB, leg pain Negative: Fever   Physical Exam  BP 136/82   Pulse (!) 102   Temp 99 F (37.2 C)   Resp 18   Ht 5' 7 (1.702 m)   Wt (!) 158.3 kg   LMP 12/19/2023 (Exact Date) Comment: has not had period since GYN procedure  SpO2 100%   BMI 54.66 kg/m  Gen:   Awake, no distress  Resp:  Normal effort  MSK:   Moves extremities without difficulty  Other:    Medical Decision Making  Medically screening exam initiated at 5:52 PM.  Appropriate orders placed.  Alexandra Newton was informed that the remainder of the evaluation will be completed by another provider, this initial triage assessment does not replace that evaluation, and the importance of remaining in the ED until their evaluation is complete.  Labs, EKG, cxr, trop   Alexandra Newton, Alexandra Newton 03/27/24 1757

## 2024-03-27 NOTE — ED Triage Notes (Signed)
 C/O left calf pain that started at 3am/ [facility]. Denies CP. Axox4. Denies recently traveling. Pt states unnoticeable swelling.

## 2024-03-27 NOTE — ED Triage Notes (Signed)
 Left calf pain since 4am.  States it hurts to touch leg and the leg feels warm to the touch

## 2024-03-27 NOTE — ED Notes (Signed)
 Patient is being discharged from the Urgent Care and sent to the Emergency Department via private vehicle . Per PA, patient is in need of higher level of care due to left lower leg swelling and pain and shortness of breath. Patient is aware and verbalizes understanding of plan of care.  Vitals:   03/27/24 1541  BP: (!) 142/82  Pulse: (!) 104  Resp: 20  Temp: 98.5 F (36.9 C)  SpO2: 98%

## 2024-03-27 NOTE — Assessment & Plan Note (Signed)
 Contributing to comorbidity and complicating medical management  Body mass index is 54.66 kg/m.  Nutritional follow up as an out pt would be recommended

## 2024-03-27 NOTE — Subjective & Objective (Signed)
 Pt had heavy vaginal bleeding for the past 3 months For the past few weeks a has been passing blood clots  OB re megace  20 mg PO bid Hg down to 6.0  She has had left leg cramping d.dimer 2.9

## 2024-03-27 NOTE — ED Notes (Signed)
 Lab has called with a hgb of 6.0  acuity  increased

## 2024-03-27 NOTE — Assessment & Plan Note (Signed)
 Obtain anemia panel  Transfuse for Hg <7   Follow cbc Appreciate OB/GYN recommendations started on Megace 

## 2024-03-28 ENCOUNTER — Other Ambulatory Visit (HOSPITAL_COMMUNITY): Payer: Self-pay

## 2024-03-28 ENCOUNTER — Telehealth (HOSPITAL_COMMUNITY): Payer: Self-pay | Admitting: Pharmacy Technician

## 2024-03-28 ENCOUNTER — Observation Stay (HOSPITAL_BASED_OUTPATIENT_CLINIC_OR_DEPARTMENT_OTHER)

## 2024-03-28 ENCOUNTER — Encounter: Payer: Self-pay | Admitting: Oncology

## 2024-03-28 ENCOUNTER — Observation Stay (HOSPITAL_COMMUNITY)

## 2024-03-28 DIAGNOSIS — M7989 Other specified soft tissue disorders: Secondary | ICD-10-CM | POA: Diagnosis not present

## 2024-03-28 DIAGNOSIS — D649 Anemia, unspecified: Secondary | ICD-10-CM | POA: Diagnosis not present

## 2024-03-28 DIAGNOSIS — D509 Iron deficiency anemia, unspecified: Secondary | ICD-10-CM | POA: Diagnosis not present

## 2024-03-28 DIAGNOSIS — R600 Localized edema: Secondary | ICD-10-CM | POA: Diagnosis not present

## 2024-03-28 LAB — CK: Total CK: 63 U/L (ref 38–234)

## 2024-03-28 LAB — COMPREHENSIVE METABOLIC PANEL WITH GFR
ALT: 17 U/L (ref 0–44)
AST: 12 U/L — ABNORMAL LOW (ref 15–41)
Albumin: 3 g/dL — ABNORMAL LOW (ref 3.5–5.0)
Alkaline Phosphatase: 56 U/L (ref 38–126)
Anion gap: 9 (ref 5–15)
BUN: 7 mg/dL (ref 6–20)
CO2: 19 mmol/L — ABNORMAL LOW (ref 22–32)
Calcium: 8.2 mg/dL — ABNORMAL LOW (ref 8.9–10.3)
Chloride: 108 mmol/L (ref 98–111)
Creatinine, Ser: 0.57 mg/dL (ref 0.44–1.00)
GFR, Estimated: >60 mL/min (ref >60)
Glucose, Bld: 89 mg/dL (ref 70–99)
Potassium: 3.7 mmol/L (ref 3.5–5.1)
Sodium: 136 mmol/L (ref 135–145)
Total Bilirubin: 0.8 mg/dL (ref 0.0–1.2)
Total Protein: 6.4 g/dL — ABNORMAL LOW (ref 6.5–8.1)

## 2024-03-28 LAB — FERRITIN: Ferritin: 4 ng/mL — ABNORMAL LOW (ref 11–307)

## 2024-03-28 LAB — IRON AND TIBC
Iron: 13 ug/dL — ABNORMAL LOW (ref 28–170)
Saturation Ratios: 3 % — ABNORMAL LOW (ref 10.4–31.8)
TIBC: 388 ug/dL (ref 250–450)
UIBC: 375 ug/dL

## 2024-03-28 LAB — CBC
HCT: 23.6 % — ABNORMAL LOW (ref 36.0–46.0)
Hemoglobin: 7 g/dL — ABNORMAL LOW (ref 12.0–15.0)
MCH: 24 pg — ABNORMAL LOW (ref 26.0–34.0)
MCHC: 29.7 g/dL — ABNORMAL LOW (ref 30.0–36.0)
MCV: 80.8 fL (ref 80.0–100.0)
Platelets: 375 K/uL (ref 150–400)
RBC: 2.92 MIL/uL — ABNORMAL LOW (ref 3.87–5.11)
RDW: 16.5 % — ABNORMAL HIGH (ref 11.5–15.5)
WBC: 6.7 K/uL (ref 4.0–10.5)
nRBC: 0 % (ref 0.0–0.2)

## 2024-03-28 LAB — RETICULOCYTES
Immature Retic Fract: 14.9 % (ref 2.3–15.9)
RBC.: 2.85 MIL/uL — ABNORMAL LOW (ref 3.87–5.11)
Retic Count, Absolute: 79.5 K/uL (ref 19.0–186.0)
Retic Ct Pct: 2.8 % (ref 0.4–3.1)

## 2024-03-28 LAB — FOLATE: Folate: 12.9 ng/mL (ref >5.9)

## 2024-03-28 LAB — VITAMIN B12: Vitamin B-12: 194 pg/mL (ref 180–914)

## 2024-03-28 LAB — PHOSPHORUS
Phosphorus: 4.2 mg/dL (ref 2.5–4.6)
Phosphorus: 4.3 mg/dL (ref 2.5–4.6)

## 2024-03-28 LAB — TSH: TSH: 2.613 u[IU]/mL (ref 0.350–4.500)

## 2024-03-28 LAB — MAGNESIUM
Magnesium: 2.1 mg/dL (ref 1.7–2.4)
Magnesium: 2.2 mg/dL (ref 1.7–2.4)

## 2024-03-28 LAB — PROTIME-INR
INR: 1.1 (ref 0.8–1.2)
Prothrombin Time: 15 s (ref 11.4–15.2)

## 2024-03-28 LAB — HIV ANTIBODY (ROUTINE TESTING W REFLEX): HIV Screen 4th Generation wRfx: NONREACTIVE

## 2024-03-28 MED ORDER — APIXABAN 5 MG PO TABS: 5.0000 mg | ORAL_TABLET | Freq: Two times a day (BID) | ORAL | Status: DC

## 2024-03-28 MED ORDER — ALPRAZOLAM 0.25 MG PO TABS: 0.2500 mg | ORAL_TABLET | Freq: Two times a day (BID) | ORAL | Status: DC | PRN

## 2024-03-28 MED ORDER — SODIUM CHLORIDE 0.9% FLUSH: 3.0000 mL | INTRAVENOUS | Status: DC | PRN

## 2024-03-28 MED ORDER — ACETAMINOPHEN 650 MG RE SUPP: 650.0000 mg | Freq: Four times a day (QID) | RECTAL | Status: DC | PRN

## 2024-03-28 MED ORDER — BUSPIRONE HCL 5 MG PO TABS: 7.5000 mg | ORAL_TABLET | Freq: Two times a day (BID) | ORAL | Status: DC | PRN

## 2024-03-28 MED ORDER — FERROUS SULFATE 325 (65 FE) MG PO TABS
325.0000 mg | ORAL_TABLET | ORAL | Status: DC
  Administered 2024-03-28: 325 mg via ORAL
  Filled 2024-03-28: qty 1

## 2024-03-28 MED ORDER — IOHEXOL 350 MG/ML SOLN
75.0000 mL | Freq: Once | INTRAVENOUS | Status: AC | PRN
  Administered 2024-03-28: 75 mL via INTRAVENOUS

## 2024-03-28 MED ORDER — ONDANSETRON HCL 4 MG PO TABS: 4.0000 mg | ORAL_TABLET | Freq: Four times a day (QID) | ORAL | Status: DC | PRN

## 2024-03-28 MED ORDER — ONDANSETRON HCL 4 MG/2ML IJ SOLN: 4.0000 mg | Freq: Four times a day (QID) | INTRAMUSCULAR | Status: DC | PRN

## 2024-03-28 MED ORDER — ALBUTEROL SULFATE (2.5 MG/3ML) 0.083% IN NEBU: 2.5000 mg | INHALATION_SOLUTION | RESPIRATORY_TRACT | Status: DC | PRN

## 2024-03-28 MED ORDER — APIXABAN (ELIQUIS) VTE STARTER PACK (10MG AND 5MG)
ORAL_TABLET | ORAL | 0 refills | Status: DC
  Filled 2024-03-28: qty 74, 30d supply, fill #0

## 2024-03-28 MED ORDER — SODIUM CHLORIDE 0.9 % IV SOLN: 250.0000 mL | INTRAVENOUS | Status: DC | PRN

## 2024-03-28 MED ORDER — SODIUM CHLORIDE 0.9% FLUSH
3.0000 mL | Freq: Two times a day (BID) | INTRAVENOUS | Status: DC
  Administered 2024-03-28: 3 mL via INTRAVENOUS

## 2024-03-28 MED ORDER — APIXABAN 5 MG PO TABS
10.0000 mg | ORAL_TABLET | Freq: Two times a day (BID) | ORAL | Status: DC
  Administered 2024-03-28: 10 mg via ORAL
  Filled 2024-03-28: qty 2

## 2024-03-28 MED ORDER — ACETAMINOPHEN 325 MG PO TABS: 650.0000 mg | ORAL_TABLET | Freq: Four times a day (QID) | ORAL | Status: DC | PRN

## 2024-03-28 MED ORDER — HYDROCODONE-ACETAMINOPHEN 5-325 MG PO TABS
1.0000 | ORAL_TABLET | ORAL | Status: DC | PRN
  Administered 2024-03-28: 1 via ORAL
  Filled 2024-03-28: qty 1

## 2024-03-28 NOTE — Plan of Care (Signed)
  Problem: Education: Goal: Knowledge of General Education information will improve Description: Including pain rating scale, medication(s)/side effects and non-pharmacologic comfort measures Outcome: Progressing   Problem: Health Behavior/Discharge Planning: Goal: Ability to manage health-related needs will improve Outcome: Progressing   Problem: Clinical Measurements: Goal: Ability to maintain clinical measurements within normal limits will improve Outcome: Progressing Goal: Will remain free from infection Outcome: Progressing   Problem: Activity: Goal: Risk for activity intolerance will decrease Outcome: Progressing   Problem: Pain Managment: Goal: General experience of comfort will improve and/or be controlled Outcome: Progressing   Problem: Safety: Goal: Ability to remain free from injury will improve Outcome: Progressing   Problem: Skin Integrity: Goal: Risk for impaired skin integrity will decrease Outcome: Progressing

## 2024-03-28 NOTE — Discharge Summary (Signed)
 Physician Discharge Summary   Patient: Alexandra Newton MRN: [id number] DOB: 12-27-1990  Admit date:     03/27/2024  Discharge date: 03/28/24  Discharge Physician: Sabas GORMAN Brod   PCP: Waylan Almarie SAUNDERS, MD   Recommendations at discharge:   Stop taking tranexamic acid Take Eliquis  as directed for 3 months Follow-up with GYN for further recommendations regarding menstrual bleeding  Discharge Diagnoses: Principal Problem:   Symptomatic anemia Active Problems:   Morbid obesity (HCC)   IDA (iron  deficiency anemia)   Leg edema, left  Resolved Problems:   * No resolved hospital problems. *  Hospital Course: 33 y.o. female with medical history significant of anemia anxiety obesity presented with   shortness of breath Pt had heavy vaginal bleeding for the past 3 months For the past few weeks a has been passing blood clots  OB re megace  20 mg PO bid Hg down to 6.0   She has had left leg cramping d.dimer 2.9   Reports left lower extremity pain and swelling for the past 24 hours Endorses decreased ambulation Denies significant ETOH intake   Does not smoke   Assessment and Plan:  DVT - Venous duplex of left lower extremity showed DVT involving peroneal vein and left gastrocnemius vein - Started on Eliquis  for 3 months - CTA chest was negative for pulmonary embolism  Iron  deficiency anemia - Insetting of heavy menstrual bleeding - Status post 2 units of PRBC, hemoglobin is 7.0 this morning - Continue iron  supplementation - Follow-up OB/GYN as outpatient - Stop tranexamic acid as it can increase the risk of blood clots   Morbid obesity (HCC) Contributing to comorbidity and complicating medical management  Body mass index is 54.66 kg/m.   Nutritional follow up as an out pt would be recommended       Consultants:  Procedures performed:  Disposition: Home Diet recommendation:  Discharge Diet Orders (From admission, onward)     Start     Ordered   03/28/24 0000  Diet -  low sodium heart healthy        03/28/24 1550           Regular diet DISCHARGE MEDICATION: Allergies as of 03/28/2024       Reactions   Chocolate    Inflammation    Pork-derived Products    Inflammation         Medication List     STOP taking these medications    Gallifrey  5 MG tablet Generic drug: norethindrone    phentermine 37.5 MG tablet Commonly known as: ADIPEX-P       TAKE these medications    acetaminophen  500 MG tablet Commonly known as: TYLENOL  Take 1 tablet (500 mg total) by mouth every 6 (six) hours as needed. What changed: reasons to take this   Airborne Elderberry Chew Chew 1 capsule by mouth every other day.   ALPRAZolam  0.25 MG tablet Commonly known as: XANAX  Take 0.25 mg by mouth 2 (two) times daily as needed for anxiety.   Apixaban  Starter Pack (10mg  and 5mg ) Commonly known as: ELIQUIS  STARTER PACK Take as directed on package: start with two-5mg  tablets twice daily for 7 days. On day 8, switch to one-5mg  tablet twice daily.   busPIRone  7.5 MG tablet Commonly known as: BUSPAR  Take 1 tablet (7.5 mg total) by mouth 2 (two) times daily. For anxiety What changed:  how much to take when to take this additional instructions   ferrous sulfate  325 (65 FE) MG EC tablet Take 1  tablet (325 mg total) by mouth every other day.   Vitamin D3 125 MCG (5000 UT) Tabs Take 5,000 Units by mouth every evening.        Discharge Exam: Filed Weights   03/27/24 1733 03/27/24 2357  Weight: (!) 158.3 kg (!) 158.6 kg   General-appears in no acute distress Heart-S1-S2, regular, no murmur auscultated Lungs-clear to auscultation bilaterally, no wheezing or crackles auscultated Abdomen-soft, nontender, no organomegaly Extremities-no edema in the lower extremities Neuro-alert, oriented x3, no focal deficit noted  Condition at discharge: good  The results of significant diagnostics from this hospitalization (including imaging, microbiology, ancillary  and laboratory) are listed below for reference.   Imaging Studies: VAS US  LOWER EXTREMITY VENOUS (DVT) (7a-7p) Result Date: 03/28/2024  Lower Venous DVT Study Patient Name:  Alexandra Newton  Date of Exam:   03/28/2024 Medical Rec #: [id number]    Accession #:    [id number] Date of Birth: 1991-06-02     Patient Gender: F Patient Age:   33 years Exam Location:  [facility] Procedure:      VAS US  LOWER EXTREMITY VENOUS (DVT) Referring Phys: BLEASE DOUTOVA --------------------------------------------------------------------------------  Indications: Swelling.  Risk Factors: None identified. Limitations: Body habitus and poor ultrasound/tissue interface. Comparison Study: No prior studies. Performing Technologist: Cordella Collet RVT  Examination Guidelines: A complete evaluation includes B-mode imaging, spectral Doppler, color Doppler, and power Doppler as needed of all accessible portions of each vessel. Bilateral testing is considered an integral part of a complete examination. Limited examinations for reoccurring indications may be performed as noted. The reflux portion of the exam is performed with the patient in reverse Trendelenburg.  +-----+---------------+---------+-----------+----------+--------------+ RIGHTCompressibilityPhasicitySpontaneityPropertiesThrombus Aging +-----+---------------+---------+-----------+----------+--------------+ CFV  Full           Yes      Yes                                 +-----+---------------+---------+-----------+----------+--------------+   +---------+---------------+---------+-----------+----------+--------------+ LEFT     CompressibilityPhasicitySpontaneityPropertiesThrombus Aging +---------+---------------+---------+-----------+----------+--------------+ CFV      Full           Yes      Yes                                 +---------+---------------+---------+-----------+----------+--------------+ SFJ      Full                                                         +---------+---------------+---------+-----------+----------+--------------+ FV Prox  Full                                                        +---------+---------------+---------+-----------+----------+--------------+ FV Mid   Full                                                        +---------+---------------+---------+-----------+----------+--------------+ FV DistalFull                                                        +---------+---------------+---------+-----------+----------+--------------+  PFV      Full                                                        +---------+---------------+---------+-----------+----------+--------------+ POP      Full           Yes      Yes                                 +---------+---------------+---------+-----------+----------+--------------+ PTV      Full                                                        +---------+---------------+---------+-----------+----------+--------------+ PERO     Partial                                      Acute          +---------+---------------+---------+-----------+----------+--------------+ Gastroc  None                                         Acute          +---------+---------------+---------+-----------+----------+--------------+    Summary: RIGHT: - No evidence of common femoral vein obstruction.   LEFT: - Findings consistent with acute deep vein thrombosis involving the left peroneal veins. Findings consistent with acute intramuscular thrombosis involving the left gastrocnemius veins.  *See table(s) above for measurements and observations.    Preliminary    CT Angio Chest PE W and/or Wo Contrast Result Date: 03/28/2024 CLINICAL DATA:  Elevated D-dimer and shortness of breath, initial encounter EXAM: CT ANGIOGRAPHY CHEST WITH CONTRAST TECHNIQUE: Multidetector CT imaging of the chest was performed using the standard protocol during bolus  administration of intravenous contrast. Multiplanar CT image reconstructions and MIPs were obtained to evaluate the vascular anatomy. RADIATION DOSE REDUCTION: This exam was performed according to the departmental dose-optimization program which includes automated exposure control, adjustment of the mA and/or kV according to patient size and/or use of iterative reconstruction technique. CONTRAST:  75mL OMNIPAQUE  IOHEXOL  350 MG/ML SOLN COMPARISON:  Chest x-ray from the previous day. 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 No aneurysmal dilatation or dissection is seen. The heart is mildly enlarged in size. The pulmonary artery shows no evidence of pulmonary emboli. Mediastinum/Nodes: Thoracic inlet is within normal limits. No hilar or mediastinal adenopathy is noted. The esophagus as visualized is within normal limits. Lungs/Pleura: Lungs are clear. No pleural effusion or pneumothorax. Upper Abdomen: Visualized upper abdomen is within normal limits. Musculoskeletal: Mild degenerative changes of the thoracic spine are noted. No acute abnormality noted. Review of the MIP images confirms the above findings. IMPRESSION: No evidence of pulmonary emboli. No acute abnormality seen. Electronically Signed   By: Oneil Devonshire M.D.   On: 03/28/2024 02:28   DG Chest 2 View Result Date: 03/27/2024 CLINICAL DATA:  Shortness of breath. EXAM: CHEST - 2 VIEW COMPARISON:  Radiograph and CT 09/20/2023 FINDINGS: The cardiomediastinal contours are normal. The lungs are clear. Pulmonary vasculature is normal. No consolidation, pleural effusion, or pneumothorax. No acute osseous abnormalities are seen. IMPRESSION: No active cardiopulmonary disease. Electronically Signed   By: Andrea Gasman M.D.   On: 03/27/2024 20:53    Microbiology: Results for orders placed or performed in visit on 07/29/16  STREP GROUP A AG, W/REFLEX TO CULT     Status: Abnormal   Collection Time:  07/29/16 10:14 AM   Specimen: Throat  Result Value Ref Range Status   SOURCE THROAT  Final   STREGTOCOCCUS GROUP A AG SCREEN Detected (A)  Final    Comment:             ** Normal Reference Range: Not Detected **     Labs: CBC: Recent Labs  Lab 03/27/24 1802 03/28/24 0646  WBC 9.3 6.7  HGB 6.0* 7.0*  HCT 21.5* 23.6*  MCV 81.1 80.8  PLT 454* 375   Basic Metabolic Panel: Recent Labs  Lab 03/27/24 1802 03/28/24 0646  NA 138 136  K 3.6 3.7  CL 108 108  CO2 21* 19*  GLUCOSE 112* 89  BUN 8 7  CREATININE 0.62 0.57  CALCIUM 9.0 8.2*  MG  --  2.2  2.1  PHOS  --  4.3  4.2   Liver Function Tests: Recent Labs  Lab 03/27/24 1802 03/28/24 0646  AST 14* 12*  ALT 20 17  ALKPHOS 60 56  BILITOT 0.5 0.8  PROT 7.0 6.4*  ALBUMIN 3.4* 3.0*   CBG: No results for input(s): GLUCAP in the last 168 hours.  Discharge time spent: greater than 30 minutes.  Signed: Sabas GORMAN Brod, MD [facility] 03/28/2024

## 2024-03-28 NOTE — Discharge Instructions (Signed)
 Information on my medicine - ELIQUIS  (apixaban )  Why was Eliquis  prescribed for you? Eliquis  was prescribed to treat blood clots that may have been found in the veins of your legs (deep vein thrombosis) or in your lungs (pulmonary embolism) and to reduce the risk of them occurring again.  What do You need to know about Eliquis  ? The starting dose is 10 mg (two 5 mg tablets) taken TWICE daily for the FIRST SEVEN (7) DAYS, then on  04/04/24  the dose is reduced to ONE 5 mg tablet taken TWICE daily.  Eliquis  may be taken with or without food.   Try to take the dose about the same time in the morning and in the evening. If you have difficulty swallowing the tablet whole please discuss with your pharmacist how to take the medication safely.  Take Eliquis  exactly as prescribed and DO NOT stop taking Eliquis  without talking to the doctor who prescribed the medication.  Stopping may increase your risk of developing a new blood clot.  Refill your prescription before you run out.  After discharge, you should have regular check-up appointments with your healthcare provider that is prescribing your Eliquis .    What do you do if you miss a dose? If a dose of ELIQUIS  is not taken at the scheduled time, take it as soon as possible on the same day and twice-daily administration should be resumed. The dose should not be doubled to make up for a missed dose.  Important Safety Information A possible side effect of Eliquis  is bleeding. You should call your healthcare provider right away if you experience any of the following: Bleeding from an injury or your nose that does not stop. Unusual colored urine (red or dark brown) or unusual colored stools (red or black). Unusual bruising for unknown reasons. A serious fall or if you hit your head (even if there is no bleeding).  Some medicines may interact with Eliquis  and might increase your risk of bleeding or clotting while on Eliquis . To help avoid this,  consult your healthcare provider or pharmacist prior to using any new prescription or non-prescription medications, including herbals, vitamins, non-steroidal anti-inflammatory drugs (NSAIDs) and supplements.  This website has more information on Eliquis  (apixaban ): http://[email or web address] .com/eliquis dena

## 2024-03-28 NOTE — Progress Notes (Signed)
 Left lower extremity venous duplex has been completed. Preliminary results can be found in CV Proc through chart review.  Results were given to Dr. Drusilla.  03/28/24 10:40 AM Cathlyn Collet RVT

## 2024-03-28 NOTE — Plan of Care (Signed)

## 2024-03-28 NOTE — TOC Initial Note (Signed)
 Transition of Care [facility]) - Initial/Assessment Note    Patient Details  Name: Alexandra Newton MRN: [id number] Date of Birth: 1991/08/13  Transition of Care [facility]) CM/SW Contact:    Jeoffrey LITTIE Moose, LCSW Phone Number: 03/28/2024, 8:46 AM  Clinical Narrative:                 Pt admitted from home due to left calf pain. No current TOC needs please consult as needs arise.        Patient Goals and CMS Choice            Expected Discharge Plan and Services                                              Prior Living Arrangements/Services                       Activities of Daily Living   ADL Screening (condition at time of admission) Independently performs ADLs?: Yes (appropriate for developmental age) Is the patient deaf or have difficulty hearing?: No Does the patient have difficulty seeing, even when wearing glasses/contacts?: No Does the patient have difficulty concentrating, remembering, or making decisions?: No  Permission Sought/Granted                  Emotional Assessment              Admission diagnosis:  Vaginal bleeding [N93.9] Pain of left calf [M79.662] Symptomatic anemia [D64.9] Anemia, unspecified type [D64.9] Patient Active Problem List   Diagnosis Date Noted   Symptomatic anemia 03/27/2024   Leg edema, left 03/27/2024   Uterine bleeding 02/07/2024   Iron  deficiency anemia 02/07/2024   IDA (iron  deficiency anemia) 11/14/2023   Endometrial thickening on ultrasound 11/14/2023   Morbid obesity (HCC) 12/02/2015   Allergy    Anxiety    Depression    PCP:  Waylan Almarie SAUNDERS, MD Pharmacy:   [facility] - [place], [place] - [phone] [place] [address] Phone: [phone] Fax: [phone]     Social Drivers of Health (SDOH) Social History: SDOH Screenings   Food Insecurity: No Food Insecurity (03/28/2024)  Housing: Unknown (03/28/2024)  Transportation Needs: No  Transportation Needs (03/28/2024)  Utilities: Not At Risk (03/28/2024)  Alcohol Screen: Low Risk  (09/23/2020)  Depression (PHQ2-9): Medium Risk (09/23/2020)  Tobacco Use: Low Risk  (03/27/2024)   SDOH Interventions:     Readmission Risk Interventions     No data to display

## 2024-03-28 NOTE — Progress Notes (Signed)
 PHARMACY - ANTICOAGULATION CONSULT NOTE  Pharmacy Consult for apixaban  Indication: DVT  Allergies  Allergen Reactions   Chocolate     Inflammation    Pork-Derived Products     Inflammation     Patient Measurements: Height: 5' 7 (170.2 cm) Weight: (!) 158.6 kg (349 lb 10.4 oz) IBW/kg (Calculated) : 61.6 HEPARIN  DW (KG): 101.5  Vital Signs: Temp: 99 F (37.2 C) (08/14 0802) Temp Source: Oral (08/14 0802) BP: 104/80 (08/14 0802) Pulse Rate: 74 (08/14 0802)  Labs: Recent Labs    03/27/24 1802 03/27/24 2024 03/28/24 0646  HGB 6.0*  --  7.0*  HCT 21.5*  --  23.6*  PLT 454*  --  375  LABPROT  --   --  15.0  INR  --   --  1.1  CREATININE 0.62  --  0.57  CKTOTAL  --   --  63  TROPONINIHS 3 3  --     Estimated Creatinine Clearance: 158.5 mL/min (by C-G formula based on SCr of 0.57 mg/dL).   Medical History: Past Medical History:  Diagnosis Date   Allergy    seasonal   Anxiety    Anxiety    Borderline anemia    pt had 2 iron  infusions in March/April 2025   Depression    Depression    Phreesia 10/11/2020   History of bronchitis as a child    Panic attacks     Medications:  Medications Prior to Admission  Medication Sig Dispense Refill Last Dose/Taking   acetaminophen  (TYLENOL ) 500 MG tablet Take 1 tablet (500 mg total) by mouth every 6 (six) hours as needed. (Patient taking differently: Take 500 mg by mouth every 6 (six) hours as needed for mild pain (pain score 1-3).) 30 tablet 0 03/27/2024 Morning   ALPRAZolam  (XANAX ) 0.25 MG tablet Take 0.25 mg by mouth 2 (two) times daily as needed for anxiety.   03/27/2024 Morning   busPIRone  (BUSPAR ) 7.5 MG tablet Take 1 tablet (7.5 mg total) by mouth 2 (two) times daily. For anxiety (Patient taking differently: Take 3.75 mg by mouth in the morning and at bedtime.) 60 tablet 3 03/27/2024 Morning   Cholecalciferol (VITAMIN D3) 125 MCG (5000 UT) TABS Take 5,000 Units by mouth every evening.   03/26/2024 Evening   ferrous  sulfate 325 (65 FE) MG EC tablet Take 1 tablet (325 mg total) by mouth every other day. 45 tablet 3 03/25/2024   GALLIFREY  5 MG tablet Take 5 mg by mouth daily.   03/26/2024 Bedtime   Multiple Vitamins-Minerals (AIRBORNE ELDERBERRY) CHEW Chew 1 capsule by mouth every other day.   Past Week   phentermine (ADIPEX-P) 37.5 MG tablet Take 18.75-37.5 mg by mouth See admin instructions. Take 37.5 mg in the morning and 18.75 mg in the afternoon (Patient not taking: Reported on 03/27/2024)   Not Taking    Assessment: 55 yof admitted with LLE pain and swelling found to have a new DVT. She was not on [facility] PTA. She reports heavy vaginal bleeding and was prescribed TXA tablets by GYN. Hgb low at 7 - has had 2 units RBC this admit, platelets are normal. Copay is $0.  Goal of Therapy:  Full anticoagulation Monitor platelets by anticoagulation protocol: Yes   Plan:  Apixaban  10 mg PO bid for 7 days then 5 mg PO bid Education prior to d/c Pharmacy signing off but will continue to follow peripherally - please re-consult if needed  Thank you for involving pharmacy in this patient's  care.  Delon Sax, PharmD, BCPS Clinical Pharmacist Clinical phone for 03/28/2024 is [phone] 03/28/2024 10:45 AM

## 2024-03-28 NOTE — Telephone Encounter (Signed)
Patient Product/process development scientist completed.    The patient is insured through [facility]. Patient has [employer], may use a copay card, and/or apply for patient assistance if available.    Ran test claim for Eliquis 5 mg and the current 30 day co-pay is $0.00.   This test claim was processed through [facility]- copay amounts may vary at other pharmacies due to pharmacy/plan contracts, or as the patient moves through the different stages of their insurance plan.     Roland Earl, CPHT Pharmacy Technician III Certified Patient Advocate [facility] Patient Advocate Team Direct Number: [phone]  Fax: [phone]

## 2024-03-28 NOTE — ED Provider Notes (Signed)
 [facility]    CSN: [id number] Arrival date & time: 03/27/24  1536      History   Chief Complaint No chief complaint on file.   HPI Alexandra Newton is a 33 y.o. female.   Patient presenting today with sudden onset left mid posterior calf pain, slight swelling that started around 4 AM, waking her up in her sleep.  The pain has been consistent and severe ever since, unrelenting positionally or with over-the-counter pain relievers, muscle rubs, leg cramp vitamins.  She denies associated palpitations, chest pain but does have some exertional shortness of breath.  She states this shortness of breath has been an issue for a week or so but worse the last day or so.  She states she chalked that up to some significant menstrual bleeding that she has being followed by OB/GYN for, states they drew blood earlier today but she does not yet have the results.  She does have a history of anemia.  Denies any past history of DVT/PE, and denies recent surgeries, sedentary behavior, long periods of travel, smoking history.    Past Medical History:  Diagnosis Date   Allergy    seasonal   Anxiety    Anxiety    Borderline anemia    pt had 2 iron  infusions in March/April 2025   Depression    Depression    Phreesia 10/11/2020   History of bronchitis as a child    Panic attacks     Patient Active Problem List   Diagnosis Date Noted   Symptomatic anemia 03/27/2024   Leg edema, left 03/27/2024   Uterine bleeding 02/07/2024   Iron  deficiency anemia 02/07/2024   IDA (iron  deficiency anemia) 11/14/2023   Endometrial thickening on ultrasound 11/14/2023   Morbid obesity (HCC) 12/02/2015   Allergy    Anxiety    Depression     Past Surgical History:  Procedure Laterality Date   HYSTEROSCOPY WITH D & C N/A 12/19/2023   Procedure: DILATATION AND CURETTAGE /HYSTEROSCOPY;  Surgeon: Laurence Slater PARAS, MD;  Location: [facility] OR;  Service: Gynecology;  Laterality: N/A;  POSSIBLE MYOSURE LITE   INTRAUTERINE  DEVICE (IUD) INSERTION N/A 12/19/2023   Procedure: INSERTION, INTRAUTERINE DEVICE;  Surgeon: Laurence Slater PARAS, MD;  Location: [facility] OR;  Service: Gynecology;  Laterality: N/A;  MIRENA    LAPAROSCOPIC GASTRIC SLEEVE RESECTION N/A 02/22/2016   Procedure: LAPAROSCOPIC GASTRIC SLEEVE RESECTION WITH UPPER ENDO;  Surgeon: Morene Olives, MD;  Location: [facility] ORS;  Service: General;  Laterality: N/AMERL NAIL RESECTION  12/19/2023   Procedure: NAIL RESECTION;  Surgeon: Laurence Slater PARAS, MD;  Location: [facility] OR;  Service: Gynecology;;   WISDOM TOOTH EXTRACTION     4    OB History   No obstetric history on file.      Home Medications    Prior to Admission medications   Medication Sig Start Date End Date Taking? Authorizing Provider  acetaminophen  (TYLENOL ) 500 MG tablet Take 1 tablet (500 mg total) by mouth every 6 (six) hours as needed. Patient taking differently: Take 500 mg by mouth every 6 (six) hours as needed for mild pain (pain score 1-3). 12/19/23   Laurence Slater PARAS, MD  ALPRAZolam  (XANAX ) 0.25 MG tablet Take 0.25 mg by mouth 2 (two) times daily as needed for anxiety. 10/02/23   [provider]  APIXABAN  (ELIQUIS ) VTE STARTER PACK (10MG  AND 5MG ) Take as directed on package: start with two-5mg  tablets twice daily for 7 days. On day 8,  switch to one-5mg  tablet twice daily. 03/28/24   Drusilla Sabas RAMAN, MD  busPIRone  (BUSPAR ) 7.5 MG tablet Take 1 tablet (7.5 mg total) by mouth 2 (two) times daily. For anxiety Patient taking differently: Take 3.75 mg by mouth in the morning and at bedtime. 09/23/20   Bari Theodoro FALCON, MD  Cholecalciferol (VITAMIN D3) 125 MCG (5000 UT) TABS Take 5,000 Units by mouth every evening.    [provider]  ferrous sulfate  325 (65 FE) MG EC tablet Take 1 tablet (325 mg total) by mouth every other day. 11/14/23   Kandala, Hyndavi, MD  Multiple Vitamins-Minerals (AIRBORNE ELDERBERRY) CHEW Chew 1 capsule by mouth every other day.    [provider]    Family  History Family History  Problem Relation Age of Onset   Arthritis Mother    Cancer Mother        ovarian   Depression Mother    Diabetes Mother    Hypertension Mother    Obesity Mother    Alcohol abuse Father    Arthritis Father    Depression Father    Arthritis Sister    Depression Sister    Early death Maternal Grandmother    Obesity Maternal Grandmother    Cancer Maternal Grandmother        Lung    Cancer Maternal Grandfather        prostate   Diabetes Maternal Grandfather    Hypertension Maternal Grandfather    Arthritis Paternal Grandmother    Depression Paternal Grandmother    Diabetes Paternal Grandmother    Hypertension Paternal Grandmother    Hyperlipidemia Paternal Grandfather    Stroke Paternal Grandfather    Cancer Maternal Aunt        breast   Depression Maternal Aunt     Social History Social History   Tobacco Use   Smoking status: Never   Smokeless tobacco: Never  Vaping Use   Vaping status: Never Used  Substance Use Topics   Alcohol use: Yes    Comment: socially, maybe once or twice a month   Drug use: No     Allergies   Chocolate and Pork-derived products   Review of Systems Review of Systems Per HPI  Physical Exam Triage Vital Signs ED Triage Vitals  Encounter Vitals Group     BP 03/27/24 1541 (!) 142/82     Girls Systolic BP Percentile --      Girls Diastolic BP Percentile --      Boys Systolic BP Percentile --      Boys Diastolic BP Percentile --      Pulse Rate 03/27/24 1541 (!) 104     Resp 03/27/24 1541 20     Temp 03/27/24 1541 98.5 F (36.9 C)     Temp Source 03/27/24 1541 Oral     SpO2 03/27/24 1541 98 %     Weight --      Height --      Head Circumference --      Peak Flow --      Pain Score 03/27/24 1543 9     Pain Loc --      Pain Education --      Exclude from Growth Chart --    No data found.  Updated Vital Signs BP (!) 142/82 (BP Location: Right Arm)   Pulse (!) 104   Temp 98.5 F (36.9 C) (Oral)    Resp 20   LMP 12/19/2023 (Exact Date) Comment: has not had  period since GYN procedure  SpO2 98%   Visual Acuity Right Eye Distance:   Left Eye Distance:   Bilateral Distance:    Right Eye Near:   Left Eye Near:    Bilateral Near:     Physical Exam Vitals and nursing note reviewed.  Constitutional:      Comments: Anxious, tearful  HENT:     Head: Atraumatic.  Eyes:     Extraocular Movements: Extraocular movements intact.     Conjunctiva/sclera: Conjunctivae normal.  Cardiovascular:     Rate and Rhythm: Regular rhythm. Tachycardia present.     Heart sounds: Normal heart sounds.  Pulmonary:     Effort: Pulmonary effort is normal.     Breath sounds: Normal breath sounds.  Musculoskeletal:        General: Swelling and tenderness present. Normal range of motion.     Cervical back: Normal range of motion and neck supple.     Comments: Trace firm edema to the left posterior calf, tenderness to palpation centrally in this area.  Positive Homans' sign left calf.  Skin:    General: Skin is warm and dry.     Findings: No erythema.  Neurological:     Mental Status: She is alert and oriented to person, place, and time.     Comments: Left lower extremity neurovascularly intact  Psychiatric:        Mood and Affect: Mood normal.        Thought Content: Thought content normal.        Judgment: Judgment normal.      UC Treatments / Results  Labs (all labs ordered are listed, but only abnormal results are displayed) Labs Reviewed - No data to display  EKG   Radiology VAS US  LOWER EXTREMITY VENOUS (DVT) (7a-7p) Result Date: 03/28/2024  Lower Venous DVT Study Patient Name:  Alexandra Newton  Date of Exam:   03/28/2024 Medical Rec #: [id number]    Accession #:    [id number] Date of Birth: 04/08/1991     Patient Gender: F Patient Age:   17 years Exam Location:  [facility] Procedure:      VAS US  LOWER EXTREMITY VENOUS (DVT) Referring Phys: BLEASE DOUTOVA  --------------------------------------------------------------------------------  Indications: Swelling.  Risk Factors: None identified. Limitations: Body habitus and poor ultrasound/tissue interface. Comparison Study: No prior studies. Performing Technologist: Cordella Collet RVT  Examination Guidelines: A complete evaluation includes B-mode imaging, spectral Doppler, color Doppler, and power Doppler as needed of all accessible portions of each vessel. Bilateral testing is considered an integral part of a complete examination. Limited examinations for reoccurring indications may be performed as noted. The reflux portion of the exam is performed with the patient in reverse Trendelenburg.  +-----+---------------+---------+-----------+----------+--------------+ RIGHTCompressibilityPhasicitySpontaneityPropertiesThrombus Aging +-----+---------------+---------+-----------+----------+--------------+ CFV  Full           Yes      Yes                                 +-----+---------------+---------+-----------+----------+--------------+   +---------+---------------+---------+-----------+----------+--------------+ LEFT     CompressibilityPhasicitySpontaneityPropertiesThrombus Aging +---------+---------------+---------+-----------+----------+--------------+ CFV      Full           Yes      Yes                                 +---------+---------------+---------+-----------+----------+--------------+ SFJ  Full                                                        +---------+---------------+---------+-----------+----------+--------------+ FV Prox  Full                                                        +---------+---------------+---------+-----------+----------+--------------+ FV Mid   Full                                                        +---------+---------------+---------+-----------+----------+--------------+ FV DistalFull                                                         +---------+---------------+---------+-----------+----------+--------------+ PFV      Full                                                        +---------+---------------+---------+-----------+----------+--------------+ POP      Full           Yes      Yes                                 +---------+---------------+---------+-----------+----------+--------------+ PTV      Full                                                        +---------+---------------+---------+-----------+----------+--------------+ PERO     Partial                                      Acute          +---------+---------------+---------+-----------+----------+--------------+ Gastroc  None                                         Acute          +---------+---------------+---------+-----------+----------+--------------+    Summary: RIGHT: - No evidence of common femoral vein obstruction.   LEFT: - Findings consistent with acute deep vein thrombosis involving the left peroneal veins. Findings consistent with acute intramuscular thrombosis involving the left gastrocnemius veins.  *See table(s) above for measurements and observations.    Preliminary    CT Angio Chest PE W and/or Wo Contrast Result Date: 03/28/2024 CLINICAL DATA:  Elevated D-dimer and  shortness of breath, initial encounter EXAM: CT ANGIOGRAPHY CHEST WITH CONTRAST TECHNIQUE: Multidetector CT imaging of the chest was performed using the standard protocol during bolus administration of intravenous contrast. Multiplanar CT image reconstructions and MIPs were obtained to evaluate the vascular anatomy. RADIATION DOSE REDUCTION: This exam was performed according to the departmental dose-optimization program which includes automated exposure control, adjustment of the mA and/or kV according to patient size and/or use of iterative reconstruction technique. CONTRAST:  75mL OMNIPAQUE  IOHEXOL  350 MG/ML SOLN COMPARISON:  Chest x-ray from  the previous day. 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 No aneurysmal dilatation or dissection is seen. The heart is mildly enlarged in size. The pulmonary artery shows no evidence of pulmonary emboli. Mediastinum/Nodes: Thoracic inlet is within normal limits. No hilar or mediastinal adenopathy is noted. The esophagus as visualized is within normal limits. Lungs/Pleura: Lungs are clear. No pleural effusion or pneumothorax. Upper Abdomen: Visualized upper abdomen is within normal limits. Musculoskeletal: Mild degenerative changes of the thoracic spine are noted. No acute abnormality noted. Review of the MIP images confirms the above findings. IMPRESSION: No evidence of pulmonary emboli. No acute abnormality seen. Electronically Signed   By: Oneil Devonshire M.D.   On: 03/28/2024 02:28   DG Chest 2 View Result Date: 03/27/2024 CLINICAL DATA:  Shortness of breath. EXAM: CHEST - 2 VIEW COMPARISON:  Radiograph and CT 09/20/2023 FINDINGS: The cardiomediastinal contours are normal. The lungs are clear. Pulmonary vasculature is normal. No consolidation, pleural effusion, or pneumothorax. No acute osseous abnormalities are seen. IMPRESSION: No active cardiopulmonary disease. Electronically Signed   By: Andrea Gasman M.D.   On: 03/27/2024 20:53    Procedures Procedures (including critical care time)  Medications Ordered in UC Medications - No data to display  Initial Impression / Assessment and Plan / UC Course  I have reviewed the triage vital signs and the nursing notes.  Pertinent labs & imaging results that were available during my care of the patient were reviewed by me and considered in my medical decision making (see chart for details).     Patient is hypertensive, tachycardic, tearful and anxious in triage today.  Her calf pain symptoms are significantly concerning for a DVT and her shortness of breath, tachycardia merit immediate further  evaluation additionally.  For all of these reasons, recommend she go to the emergency department for further immediate evaluation.  She is readily agreeable to this, wishes to go via private vehicle and will have a family member take her.  She declines EMS transport to the emergency department.  Final Clinical Impressions(s) / UC Diagnoses   Final diagnoses:  Calf swelling  Pain of left calf  SOB (shortness of breath) on exertion     Discharge Instructions      We recommend you go to the emergency department for further evaluation.  We do not recommend that you drive yourself for safety reasons.    ED Prescriptions   None    PDMP not reviewed this encounter.   Stuart Vernell Norris, [place] 03/28/24 1636

## 2024-03-29 LAB — TYPE AND SCREEN
ABO/RH(D): A POS
Antibody Screen: NEGATIVE
Unit division: 0
Unit division: 0

## 2024-03-29 LAB — BPAM RBC
Blood Product Expiration Date: 202509132359
Blood Product Expiration Date: 202509132359
ISSUE DATE / TIME: 202508132152
ISSUE DATE / TIME: 202508140242
Unit Type and Rh: 6200
Unit Type and Rh: 6200

## 2024-04-02 ENCOUNTER — Other Ambulatory Visit: Payer: Self-pay | Admitting: *Deleted

## 2024-04-02 DIAGNOSIS — D509 Iron deficiency anemia, unspecified: Secondary | ICD-10-CM

## 2024-04-03 ENCOUNTER — Observation Stay (HOSPITAL_COMMUNITY)
Admission: EM | Admit: 2024-04-03 | Discharge: 2024-04-05 | Disposition: A | Discharge status: Home / Self Care | Attending: Obstetrics & Gynecology | Admitting: Obstetrics & Gynecology

## 2024-04-03 ENCOUNTER — Other Ambulatory Visit: Payer: Self-pay

## 2024-04-03 ENCOUNTER — Encounter (HOSPITAL_COMMUNITY): Payer: Self-pay

## 2024-04-03 DIAGNOSIS — Z86718 Personal history of other venous thrombosis and embolism: Secondary | ICD-10-CM | POA: Insufficient documentation

## 2024-04-03 DIAGNOSIS — Z7901 Long term (current) use of anticoagulants: Secondary | ICD-10-CM | POA: Insufficient documentation

## 2024-04-03 DIAGNOSIS — D62 Acute posthemorrhagic anemia: Secondary | ICD-10-CM | POA: Insufficient documentation

## 2024-04-03 DIAGNOSIS — N920 Excessive and frequent menstruation with regular cycle: Principal | ICD-10-CM | POA: Insufficient documentation

## 2024-04-03 DIAGNOSIS — N921 Excessive and frequent menstruation with irregular cycle: Secondary | ICD-10-CM | POA: Diagnosis not present

## 2024-04-03 DIAGNOSIS — D509 Iron deficiency anemia, unspecified: Secondary | ICD-10-CM | POA: Diagnosis not present

## 2024-04-03 DIAGNOSIS — F1090 Alcohol use, unspecified, uncomplicated: Secondary | ICD-10-CM | POA: Diagnosis not present

## 2024-04-03 DIAGNOSIS — I824Z2 Acute embolism and thrombosis of unspecified deep veins of left distal lower extremity: Secondary | ICD-10-CM | POA: Insufficient documentation

## 2024-04-03 DIAGNOSIS — Z7982 Long term (current) use of aspirin: Secondary | ICD-10-CM | POA: Insufficient documentation

## 2024-04-03 DIAGNOSIS — D5911 Warm autoimmune hemolytic anemia: Secondary | ICD-10-CM | POA: Diagnosis not present

## 2024-04-03 DIAGNOSIS — D649 Anemia, unspecified: Principal | ICD-10-CM

## 2024-04-03 DIAGNOSIS — Z6841 Body Mass Index (BMI) 40.0 and over, adult: Secondary | ICD-10-CM | POA: Diagnosis not present

## 2024-04-03 DIAGNOSIS — R571 Hypovolemic shock: Secondary | ICD-10-CM | POA: Insufficient documentation

## 2024-04-03 DIAGNOSIS — N939 Abnormal uterine and vaginal bleeding, unspecified: Secondary | ICD-10-CM | POA: Insufficient documentation

## 2024-04-03 LAB — CBC WITH DIFFERENTIAL/PLATELET
Abs Immature Granulocytes: 0.03 K/uL (ref 0.00–0.07)
Basophils Absolute: 0.1 K/uL (ref 0.0–0.1)
Basophils Relative: 1 %
Eosinophils Absolute: 0.1 K/uL (ref 0.0–0.5)
Eosinophils Relative: 1 %
HCT: 19.2 % — ABNORMAL LOW (ref 36.0–46.0)
Hemoglobin: 5.5 g/dL — CL (ref 12.0–15.0)
Immature Granulocytes: 0 %
Lymphocytes Relative: 23 %
Lymphs Abs: 2.5 K/uL (ref 0.7–4.0)
MCH: 23.8 pg — ABNORMAL LOW (ref 26.0–34.0)
MCHC: 28.6 g/dL — ABNORMAL LOW (ref 30.0–36.0)
MCV: 83.1 fL (ref 80.0–100.0)
Monocytes Absolute: 0.7 K/uL (ref 0.1–1.0)
Monocytes Relative: 6 %
Neutro Abs: 7.5 K/uL (ref 1.7–7.7)
Neutrophils Relative %: 69 %
Platelets: 379 K/uL (ref 150–400)
RBC: 2.31 MIL/uL — ABNORMAL LOW (ref 3.87–5.11)
RDW: 17.6 % — ABNORMAL HIGH (ref 11.5–15.5)
WBC: 10.7 K/uL — ABNORMAL HIGH (ref 4.0–10.5)
nRBC: 0 % (ref 0.0–0.2)

## 2024-04-03 LAB — BASIC METABOLIC PANEL WITH GFR
Anion gap: 12 (ref 5–15)
BUN: 8 mg/dL (ref 6–20)
CO2: 20 mmol/L — ABNORMAL LOW (ref 22–32)
Calcium: 8.5 mg/dL — ABNORMAL LOW (ref 8.9–10.3)
Chloride: 104 mmol/L (ref 98–111)
Creatinine, Ser: 0.72 mg/dL (ref 0.44–1.00)
GFR, Estimated: >60 mL/min (ref >60)
Glucose, Bld: 164 mg/dL — ABNORMAL HIGH (ref 70–99)
Potassium: 3.7 mmol/L (ref 3.5–5.1)
Sodium: 136 mmol/L (ref 135–145)

## 2024-04-03 LAB — I-STAT CHEM 8, ED
BUN: 7 mg/dL (ref 6–20)
Calcium, Ion: 1.13 mmol/L — ABNORMAL LOW (ref 1.15–1.40)
Chloride: 105 mmol/L (ref 98–111)
Creatinine, Ser: 0.6 mg/dL (ref 0.44–1.00)
Glucose, Bld: 103 mg/dL — ABNORMAL HIGH (ref 70–99)
HCT: 19 % — ABNORMAL LOW (ref 36.0–46.0)
Hemoglobin: 6.5 g/dL — CL (ref 12.0–15.0)
Potassium: 3.5 mmol/L (ref 3.5–5.1)
Sodium: 137 mmol/L (ref 135–145)
TCO2: 22 mmol/L (ref 22–32)

## 2024-04-03 LAB — HCG, SERUM, QUALITATIVE: Preg, Serum: NEGATIVE

## 2024-04-03 LAB — PREPARE RBC (CROSSMATCH)

## 2024-04-03 MED ORDER — APIXABAN 5 MG PO TABS: 10.0000 mg | ORAL_TABLET | Freq: Two times a day (BID) | ORAL | Status: DC

## 2024-04-03 MED ORDER — SODIUM CHLORIDE 0.9% IV SOLUTION: Freq: Once | INTRAVENOUS | Status: DC

## 2024-04-03 MED ORDER — ACETAMINOPHEN 325 MG PO TABS: 650.0000 mg | ORAL_TABLET | ORAL | Status: DC | PRN

## 2024-04-03 MED ORDER — NORETHINDRONE ACETATE 5 MG PO TABS
10.0000 mg | ORAL_TABLET | Freq: Every day | ORAL | Status: DC
  Administered 2024-04-04: 10 mg via ORAL
  Filled 2024-04-03 (×2): qty 2

## 2024-04-03 MED ORDER — PRENATAL MULTIVITAMIN CH
1.0000 | ORAL_TABLET | Freq: Every day | ORAL | Status: DC
  Administered 2024-04-04: 1 via ORAL
  Filled 2024-04-03 (×2): qty 1

## 2024-04-03 NOTE — ED Provider Triage Note (Cosign Needed Addendum)
 Emergency Medicine Provider Triage Evaluation Note  Alexandra Newton , a 33 y.o. female  was evaluated in triage.  Pt complains of heavy vaginal bleeding that has been excessively worsening for the last 3 days.  Patient was recently taken off her progesterone secondary to a DVT.  Currently on Eliquis .  She states she has bleeding through 2-3 pads an hour even with a tampon in.  She is short of breath.  Review of Systems  Positive:  Negative: See above   Physical Exam  BP 120/79 (BP Location: Left Arm)   Pulse (!) 124   Temp 98.7 F (37.1 C)   Resp 17   Ht 5' 7 (1.702 m)   Wt (!) 158.8 kg   LMP 12/19/2023 (Exact Date) Comment: has not had period since GYN procedure  SpO2 100%   BMI 54.82 kg/m  Gen:   Awake, no distress   Resp:  Normal effort  MSK:   Moves extremities without difficulty  Other:  Pale and tachycardic  Medical Decision Making  Medically screening exam initiated at 4:59 PM.  Appropriate orders placed.  Rea SAILOR Hanselman was informed that the remainder of the evaluation will be completed by another provider, this initial triage assessment does not replace that evaluation, and the importance of remaining in the ED until their evaluation is complete.  Patient is overall tachycardic and pale.  Do not feel that she is appropriate to go back in the waiting room.   Theotis Cameron HERO, PA-C 04/03/24 1700    Theotis Cameron HERO, PA-C 04/03/24 1704

## 2024-04-03 NOTE — ED Notes (Signed)
 Pt reports feeling heavy in her chest, EKG completed and MD notified. VSS

## 2024-04-03 NOTE — ED Triage Notes (Signed)
 Pt states she's recently here for DVT. Pt in touch with OBGYN for vaginal bleeding since May. Pt got a blood transfusion last Wednesday. Changes pad three times an hour. C/O SHOB/dizziness/weakness. Pt appears pale. C/O epigastric pain.

## 2024-04-03 NOTE — ED Provider Notes (Signed)
 [place] EMERGENCY DEPARTMENT AT [facility] Provider Note   CSN: [id number] Arrival date & time: 04/03/24  1631     Patient presents with: Vaginal Bleeding   Alexandra Newton is a 33 y.o. female here for evaluation of vaginal bleeding and generalized fatigue.  She is having vaginal bleeding over the last 3 months.  Follows with physician for women, Dr. Laurence.  Initially placed on Gillfrey subsequently developed unilateral calf pain.  She was seen in the ED at that time and also had symptomatic anemia subsequently admitted to the medicine service for transfusion.  While she was inpatient she had ultrasound which showed DVT.  Sounds like she was getting Megace  while she was inpatient.  She was seen by GYN on Monday and stopped her hormone therapy due to likely etiology of her DVT.  She has no chest pain or shortness of breath.  Just feels overall weak and fatigued.  Does have some DOE. Had PE study last week which was neg for PE at that time. Since she was taken off her medication for vag bleeding has developed worsening bleeding using a super tampon as well as a pad, bleeding through both twice an hour.  She feels lightheaded, fatigue.  No syncope however has had near syncope.    HPI     Prior to Admission medications   Medication Sig Start Date End Date Taking? Authorizing Provider  acetaminophen  (TYLENOL ) 500 MG tablet Take 1 tablet (500 mg total) by mouth every 6 (six) hours as needed. Patient taking differently: Take 500 mg by mouth every 6 (six) hours as needed for mild pain (pain score 1-3). 12/19/23   Laurence Slater PARAS, MD  ALPRAZolam  (XANAX ) 0.25 MG tablet Take 0.25 mg by mouth 2 (two) times daily as needed for anxiety. 10/02/23   [provider]  APIXABAN  (ELIQUIS ) VTE STARTER PACK (10MG  AND 5MG ) Take as directed on package: start with two-5mg  tablets twice daily for 7 days. On day 8, switch to one-5mg  tablet twice daily. 03/28/24   Drusilla Sabas RAMAN, MD  busPIRone  (BUSPAR ) 7.5 MG  tablet Take 1 tablet (7.5 mg total) by mouth 2 (two) times daily. For anxiety Patient taking differently: Take 3.75 mg by mouth in the morning and at bedtime. 09/23/20   Bari Theodoro FALCON, MD  Cholecalciferol (VITAMIN D3) 125 MCG (5000 UT) TABS Take 5,000 Units by mouth every evening.    [provider]  ferrous sulfate  325 (65 FE) MG EC tablet Take 1 tablet (325 mg total) by mouth every other day. 11/14/23   Kandala, Hyndavi, MD  Multiple Vitamins-Minerals (AIRBORNE ELDERBERRY) CHEW Chew 1 capsule by mouth every other day.    [provider]    Allergies: Chocolate and Pork-derived products    Review of Systems  Constitutional: Negative.   HENT: Negative.    Respiratory:  Positive for shortness of breath.   Cardiovascular: Negative.   All other systems reviewed and are negative.  Updated Vital Signs BP 109/63   Pulse (!) 124   Temp 98.5 F (36.9 C) (Oral)   Resp 18   Ht 5' 7 (1.702 m)   Wt (!) 158.8 kg   LMP 12/19/2023 (Exact Date) Comment: has not had period since GYN procedure  SpO2 100%   BMI 54.82 kg/m   Physical Exam Vitals and nursing note reviewed.  Constitutional:      General: She is not in acute distress.    Appearance: She is well-developed. She is ill-appearing. She  is not toxic-appearing or diaphoretic.  HENT:     Head: Normocephalic and atraumatic.  Eyes:     Pupils: Pupils are equal, round, and reactive to light.     Comments: Pale conjunctiva  Cardiovascular:     Rate and Rhythm: Regular rhythm. Tachycardia present.     Pulses: Normal pulses.          Radial pulses are 2+ on the right side and 2+ on the left side.       Popliteal pulses are 2+ on the right side and 2+ on the left side.     Heart sounds: Normal heart sounds.  Pulmonary:     Effort: Pulmonary effort is normal. No respiratory distress.     Breath sounds: Normal breath sounds and air entry.  Abdominal:     General: There is no distension.     Palpations: Abdomen is soft.      Tenderness: There is no abdominal tenderness.  Musculoskeletal:        General: Normal range of motion.     Cervical back: Normal range of motion and neck supple.     Comments: No bony tenderness, compartments soft  Skin:    General: Skin is warm and dry.     Coloration: Skin is pale.  Neurological:     General: No focal deficit present.     Mental Status: She is alert and oriented to person, place, and time.     (all labs ordered are listed, but only abnormal results are displayed) Labs Reviewed  BASIC METABOLIC PANEL WITH GFR - Abnormal; Notable for the following components:      Result Value   CO2 20 (*)    Glucose, Bld 164 (*)    Calcium 8.5 (*)    All other components within normal limits  CBC WITH DIFFERENTIAL/PLATELET - Abnormal; Notable for the following components:   WBC 10.7 (*)    RBC 2.31 (*)    Hemoglobin 5.5 (*)    HCT 19.2 (*)    MCH 23.8 (*)    MCHC 28.6 (*)    RDW 17.6 (*)    All other components within normal limits  I-STAT CHEM 8, ED - Abnormal; Notable for the following components:   Glucose, Bld 103 (*)    Calcium, Ion 1.13 (*)    Hemoglobin 6.5 (*)    HCT 19.0 (*)    All other components within normal limits  HCG, SERUM, QUALITATIVE  CBC WITH DIFFERENTIAL/PLATELET  PREGNANCY, URINE  COMPREHENSIVE METABOLIC PANEL WITH GFR  CBC  TYPE AND SCREEN  PREPARE RBC (CROSSMATCH)    EKG: None  Radiology: No results found.   .Critical Care  Performed by: Edie Rosebud LABOR, PA-C Authorized by: Edie Rosebud LABOR, PA-C   Critical care provider statement:    Critical care time (minutes):  35   Critical care was necessary to treat or prevent imminent or life-threatening deterioration of the following conditions:  Endocrine crisis   Critical care was time spent personally by me on the following activities:  Development of treatment plan with patient or surrogate, discussions with consultants, evaluation of patient's response to treatment,  examination of patient, ordering and review of laboratory studies, ordering and review of radiographic studies, ordering and performing treatments and interventions, pulse oximetry, re-evaluation of patient's condition and review of old charts   Medications Ordered in the ED  0.9 %  sodium chloride  infusion (Manually program via Guardrails IV Fluids) (has no administration in time range)  prenatal multivitamin tablet 1 tablet (has no administration in time range)  acetaminophen  (TYLENOL ) tablet 650 mg (has no administration in time range)  apixaban  (ELIQUIS ) tablet 10 mg (has no administration in time range)  norethindrone  (AYGESTIN ) tablet 10 mg (has no administration in time range)    33 year old here for evaluation of vaginal bleeding.  Has been seen by OB/GYN for similar.  Placed on hormonal therapy to help with bleeding however subsequently in the interim was admitted for symptomatic anemia and found DVT.  She was started on exogenous hormone use and was started on Eliquis .  Since being started on Eliquis  she has had a significant increase in her vaginal bleeding having to wear pads and tampons at the same time, changing multiple times an hour.  Here she is tachycardic, pale.  She denies any pain.  Does have some dyspnea on exertion which she states she has had since she has had recurrent anemia.  Will plan on labs, touch base with GYN and reassess  Labs personally viewed and interpreted:  BMP wo acute abnormality Istat chem 8 Hgb 6.5 CBC leukocytosis 10.7, hemoglobin 5.5 Preg neg  Discussed results with Dr. Dannielle with physicians for women.  Agrees to evaluate patient for admission.  I have placed orders for blood transfusion for symptomatic anemia.  I suspect her tachycardia likely due to her symptomatic anemia.  Low suspicion for PE, infectious process.  The patient appears reasonably stabilized for admission considering the current resources, flow, and capabilities available in the ED  at this time, and I doubt any other [facility] requiring further screening and/or treatment in the ED prior to admission.   Clinical Course as of 04/03/24 2301  Wed Apr 03, 2024  2148 Dr. Dannielle with OB/GYN will plan to see patient for admission [BH]    Clinical Course User Index [BH] Ame Heagle A, PA-C                                 Medical Decision Making Amount and/or Complexity of Data Reviewed External Data Reviewed: labs, radiology and notes. Labs: ordered. Decision-making details documented in ED Course.  Risk OTC drugs. Prescription drug management. Decision regarding hospitalization. Diagnosis or treatment significantly limited by social determinants of health.       Final diagnoses:  Vaginal bleeding  Symptomatic anemia    ED Discharge Orders     None          Mackensey Bolte A, PA-C 04/03/24 2301    Bernard Drivers, MD 04/04/24 1745

## 2024-04-03 NOTE — Consult Note (Signed)
 Initial Consultation Note   Patient: Alexandra Newton FMW:[id number] DOB: 1990-08-23 PCP: Waylan Almarie SAUNDERS, MD DOA: 04/03/2024 DOS: the patient was seen and examined on 04/03/2024 Primary service: Dannielle Bouchard, DO  Referring physician: Dannielle Bouchard, DO Reason for consult: Medical co management   Assessment/Plan: Assessment and Plan:  33 y/o F with hx of HMB, endometrial thickening, with hx of D+C, polypectomy and IUD insertion in 5/''25 (although IUD later expelled), IDA, morbid obesity, recent distal DVT Dx 8/14 and initiated on Eliquis , returns with continued HMB and symptomatic anemia.   Heavy menstrual bleeding  Hx of endometrial hyperplasia s/p D+C, polypectomy in 5/'25; IUD expelled  -- Management per Ob/GYN service, restarting on Norethindrone . Per note to be referred for possible hysterectomy  -- Navos management per below; discussed with Gyn   Symptomatic anemia, with acute blood loss anemia.  Hx of IDA  Baseline hemoglobin was around 10-11 in 5/' 25 although with recent heavy menstrual bleeding worsened anemia; last admission p/w Hb 6 -> improved to 7 after transfusion on 8/14. Now presenting at 5.5. -- 1 U PRBC transfused; ordered for 2nd unit. serial blood counts  -- Management of HMB per Gyn   Hypotension, likely hemorrhagic  Tachycardic in 100-120s, BP dropped to 70/50s and now improved in 140s sytolic at time of evaluation   -- Considering her hypotension and distal DVT, prefer initial hold on anticoagulation until stabilized and transfused.  -- Blood transfusions per above. Give 1 L IVF  -- Check Lactate.   Hx recent distal DVT LLE  8/14 Venous duplex with L peroneal, gastrocnemius thrombosis.  -- See hypotension, prefer to hold [facility] tonight. In the morning would prefer short acting agent like heparin  gtt while inpatient. May consider switch to Enoxaparin  outpatient which will be easier to interrupt / reverse if she has heavier bleeding. Discussed with patient.  -- Repeat  venous duplex for surveillance of DVT  -- Duration of her anticoagulation for provoked distal DVT with higher risk for propagation (while on hormonal therapy) should be at least 3 months.   Chronic medical problems:  IDA: should continue on iron  supplementation.  Morbid obesity: Would benefit from weight loss outpatient.   Dispo: OB/Gyn as primary, [facility] will continue to follow for medical comanagement   [facility] will continue to follow the patient.  ============================================  HPI: Alexandra Newton is a 33 y.o. female with past medical history of HMB, endometrial thickening, with hx of D+C, polypectomy and IUD insertion in 5/''25 (although IUD later expelled), IDA, morbid obesity, recent distal DVT Dx 8/14 and initiated on Eliquis , returns with continued HMB.   Reports that she has chronically had irregular periods, sometimes skipped periods and irregular menses. However since May of this year has had essentially daily heavy menstrual bleeding. This improved somewhat but returned after D+C in May. Also had improved with progestin therapy. Once started on [facility] has been heavier. Recently has been using both tampons and pads and changing at least q 15 minutes sometimes soaking through. Does have lightheadedness when OOB and exertional shortness of breath. Denies any hx of bleeding issues in the more remote past, easy bleeding with dental procedures. She has no family hx of bleeding disorder.   Re: her recent DVT had discomfort in the calf and warmth with minimal swelling. Symptoms have improved over past few days. Denies any chest pain.  No hx of VTE prior to recent diagnosis.     Review of Systems: As mentioned in the history of present  illness. All other systems reviewed and are negative. Past Medical History:  Diagnosis Date   Allergy    seasonal   Anxiety    Anxiety    Borderline anemia    pt had 2 iron  infusions in March/April 2025   Depression    Depression    Phreesia  10/11/2020   History of bronchitis as a child    Panic attacks    Past Surgical History:  Procedure Laterality Date   HYSTEROSCOPY WITH D & C N/A 12/19/2023   Procedure: DILATATION AND CURETTAGE /HYSTEROSCOPY;  Surgeon: Laurence Slater PARAS, MD;  Location: [facility] OR;  Service: Gynecology;  Laterality: N/A;  POSSIBLE MYOSURE LITE   INTRAUTERINE DEVICE (IUD) INSERTION N/A 12/19/2023   Procedure: INSERTION, INTRAUTERINE DEVICE;  Surgeon: Laurence Slater PARAS, MD;  Location: [facility] OR;  Service: Gynecology;  Laterality: N/A;  MIRENA    LAPAROSCOPIC GASTRIC SLEEVE RESECTION N/A 02/22/2016   Procedure: LAPAROSCOPIC GASTRIC SLEEVE RESECTION WITH UPPER ENDO;  Surgeon: Morene Olives, MD;  Location: [facility] ORS;  Service: General;  Laterality: N/AMERL NAIL RESECTION  12/19/2023   Procedure: NAIL RESECTION;  Surgeon: Laurence Slater PARAS, MD;  Location: [facility] OR;  Service: Gynecology;;   WISDOM TOOTH EXTRACTION     4   Social History:  reports that she has never smoked. She has never used smokeless tobacco. She reports current alcohol use. She reports that she does not use drugs.  Allergies  Allergen Reactions   Chocolate Other (See Comments)    Inflammation (allergy testing completed)   Pork-Derived Products     Inflammation     Family History  Problem Relation Age of Onset   Arthritis Mother    Cancer Mother        ovarian   Depression Mother    Diabetes Mother    Hypertension Mother    Obesity Mother    Alcohol abuse Father    Arthritis Father    Depression Father    Arthritis Sister    Depression Sister    Early death Maternal Grandmother    Obesity Maternal Grandmother    Cancer Maternal Grandmother        Lung    Cancer Maternal Grandfather        prostate   Diabetes Maternal Grandfather    Hypertension Maternal Grandfather    Arthritis Paternal Grandmother    Depression Paternal Grandmother    Diabetes Paternal Grandmother    Hypertension Paternal Grandmother    Hyperlipidemia Paternal Grandfather     Stroke Paternal Grandfather    Cancer Maternal Aunt        breast   Depression Maternal Aunt     Prior to Admission medications   Medication Sig Start Date End Date Taking? Authorizing Provider  acetaminophen  (TYLENOL ) 500 MG tablet Take 1 tablet (500 mg total) by mouth every 6 (six) hours as needed. Patient taking differently: Take 1,000 mg by mouth every 6 (six) hours as needed for mild pain (pain score 1-3). 12/19/23  Yes Laurence Slater PARAS, MD  ALPRAZolam  (XANAX ) 0.25 MG tablet Take 0.25 mg by mouth 2 (two) times daily as needed for anxiety. 10/02/23  Yes [provider]  APIXABAN  (ELIQUIS ) VTE STARTER PACK (10MG  AND 5MG ) Take as directed on package: start with two-5mg  tablets twice daily for 7 days. On day 8, switch to one-5mg  tablet twice daily. 03/28/24  Yes Drusilla Sabas RAMAN, MD  busPIRone  (BUSPAR ) 7.5 MG tablet Take 1 tablet (7.5 mg total) by mouth 2 (two)  times daily. For anxiety Patient taking differently: Take 3.75-7.5 mg by mouth in the morning and at bedtime. Take one tablet (7.5mg ) in the morning and take one-half tablet (3.75mg ) 09/23/20  Yes Damon, Theodoro FALCON, MD  Cholecalciferol (VITAMIN D3) 125 MCG (5000 UT) TABS Take 5,000 Units by mouth at bedtime.   Yes [provider]  ferrous sulfate  325 (65 FE) MG EC tablet Take 1 tablet (325 mg total) by mouth every other day. Patient taking differently: Take 325 mg by mouth 2 (two) times daily. 11/14/23  Yes Davonna Siad, MD  Multiple Vitamins-Minerals (AIRBORNE ELDERBERRY) CHEW Chew 1 capsule by mouth every other day.   Yes [provider]  norethindrone  (GALLIFREY ) 5 MG tablet Take 15 mg by mouth daily.   Yes [provider]    Physical Exam: Vitals:   04/03/24 1830 04/03/24 2242 04/03/24 2300 04/03/24 2313  BP: 106/86 109/63 (!) 75/59 (!) 75/59  Pulse: (!) 114 (!) 124 (!) 112 (!) 112  Resp: 17 18 (!) 25 17  Temp:  98.5 F (36.9 C)  98.6 F (37 C)  TempSrc:  Oral  Oral  SpO2: 100% 100% 100% 100%   Weight:      Height:        Gen: Awake, alert, NAD   CV: Regular, normal S1, S2, no murmurs  Resp: Normal WOB, CTAB  Abd: Obese, normoactive, nontender MSK: Symmetric, no edema  Skin: No rashes or lesions to exposed skin  Neuro: Alert and interactive  Psych: euthymic, appropriate    Data Reviewed:   Bicarb 20, AG 12  WBC 10, Hb 5.5, PLT 379,  Hcg neg    Family Communication: None  Primary team communication: Yes messaged Dr. Dannielle  Thank you very much for involving us  in the care of your patient.  Author: Dorn Dawson, MD 04/03/2024 11:37 PM  For on call review [email or web address].

## 2024-04-03 NOTE — H&P (Addendum)
 Alexandra Newton is an 33 y.o. female G0 with menorrhagia to anemia.  Patient was admitted one week ago with the same complaint and received blood transfusion x 2u PRBCs; Hgb 7 on d/c.  During this admission, she was also dx with LLE DVE and started on Eliquis .  CTA chest negative 8/14.    Since starting Eliquis , her bleeding has increased. Patient reports for the last 2-3 days, having to wear a pad and tampon and changing q 30 minutes.  Hgb in ED 5.5.  Denies cramping.  Pain in LLE is resolved.  Patient denies CP and HA but states with activity, she feels SOB.  She reports feeling weak.  Voided once since in ED and changed her pad then.    Patient is managed by Dr. 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  Patient has been given TXA, norethindrone  after IUD was expelled s/p insert following D&C.  Norethindrone  did help decrease flow.    Pertinent Gynecological History: Menses: flow is excessive with use of 24 pads or tampons on heaviest days Bleeding: dysfunctional uterine bleeding Contraception: none DES exposure: unknown Blood transfusions: 2u 8/14 Sexually transmitted diseases: no past history Previous GYN Procedures: DNC  Last mammogram: n/a Date: n/a Last pap: normal Date: 2021 OB History: G0  Menstrual History: Menarche age: n/a Patient's last menstrual period was 12/19/2023 (exact date).    Past Medical History:  Diagnosis Date   Allergy    seasonal   Anxiety    Anxiety    Borderline anemia    pt had 2 iron  infusions in March/April 2025   Depression    Depression    Phreesia 10/11/2020   History of bronchitis as a child    Panic attacks     Past Surgical History:  Procedure Laterality Date   HYSTEROSCOPY WITH D & C N/A 12/19/2023   Procedure: DILATATION AND CURETTAGE /HYSTEROSCOPY;  Surgeon: Laurence Slater PARAS, MD;  Location: [facility];  Service: Gynecology;  Laterality: N/A;  POSSIBLE  MYOSURE LITE   INTRAUTERINE DEVICE (IUD) INSERTION N/A 12/19/2023   Procedure: INSERTION, INTRAUTERINE DEVICE;  Surgeon: Laurence Slater PARAS, MD;  Location: [facility] OR;  Service: Gynecology;  Laterality: N/A;  MIRENA    LAPAROSCOPIC GASTRIC SLEEVE RESECTION N/A 02/22/2016   Procedure: LAPAROSCOPIC GASTRIC SLEEVE RESECTION WITH UPPER ENDO;  Surgeon: Morene Olives, MD;  Location: [facility] ORS;  Service: General;  Laterality: N/A;   MYOSURE RESECTION  12/19/2023   Procedure: MELINDA RESECTION;  Surgeon: Laurence Slater PARAS, MD;  Location: [facility] OR;  Service: Gynecology;;   WISDOM TOOTH EXTRACTION     4    Family History  Problem Relation Age of Onset   Arthritis Mother    Cancer Mother        ovarian   Depression Mother    Diabetes Mother    Hypertension Mother    Obesity Mother    Alcohol abuse Father    Arthritis Father    Depression Father    Arthritis Sister    Depression Sister    Early death Maternal Grandmother    Obesity Maternal Grandmother    Cancer Maternal Grandmother        Lung    Cancer Maternal Grandfather        prostate   Diabetes Maternal Grandfather    Hypertension Maternal Grandfather    Arthritis Paternal Grandmother    Depression Paternal Grandmother    Diabetes  Paternal Grandmother    Hypertension Paternal Grandmother    Hyperlipidemia Paternal Grandfather    Stroke Paternal Grandfather    Cancer Maternal Aunt        breast   Depression Maternal Aunt     Social History:  reports that she has never smoked. She has never used smokeless tobacco. She reports current alcohol use. She reports that she does not use drugs.  Allergies:  Allergies  Allergen Reactions   Chocolate Other (See Comments)    Inflammation (allergy testing completed)   Pork-Derived Products     Inflammation     (Not in a hospital admission)   Review of Systems  Blood pressure 109/63, pulse (!) 124, temperature 98.5 F (36.9 C), temperature source Oral, resp. rate 18, height 5' 7 (1.702 m), weight  (!) 158.8 kg, last menstrual period 12/19/2023, SpO2 100%. Physical Exam Constitutional:      Appearance: She is obese.  HENT:     Head: Normocephalic and atraumatic.  Cardiovascular:     Rate and Rhythm: Tachycardia present.  Pulmonary:     Effort: Pulmonary effort is normal.  Abdominal:     Palpations: Abdomen is soft.  Genitourinary:    General: Normal vulva.     Comments: Thin pad on and 50% stained; no clots.  Patient reports she has had this pad on x few hours Musculoskeletal:        General: Normal range of motion.     Cervical back: Normal range of motion.  Skin:    General: Skin is warm and dry.  Neurological:     Mental Status: She is alert and oriented to person, place, and time.  Psychiatric:        Mood and Affect: Mood normal.        Behavior: Behavior normal.     Results for orders placed or performed during the hospital encounter of 04/03/24 (from the past 24 hours)  Basic metabolic panel     Status: Abnormal   Collection Time: 04/03/24  5:00 PM  Result Value Ref Range   Sodium 136 135 - 145 mmol/L   Potassium 3.7 3.5 - 5.1 mmol/L   Chloride 104 98 - 111 mmol/L   CO2 20 (L) 22 - 32 mmol/L   Glucose, Bld 164 (H) 70 - 99 mg/dL   BUN 8 6 - 20 mg/dL   Creatinine, Ser 9.27 0.44 - 1.00 mg/dL   Calcium 8.5 (L) 8.9 - 10.3 mg/dL   GFR, Estimated >39 >39 mL/min   Anion gap 12 5 - 15  Type and screen [facility]     Status: None (Preliminary result)   Collection Time: 04/03/24  5:50 PM  Result Value Ref Range   ABO/RH(D) A POS    Antibody Screen NEG    Sample Expiration 04/06/2024,2359    Unit Number [id number]    Blood Component Type RED CELLS,LR    Unit division 00    Status of Unit REL FROM [facility]    Transfusion Status OK TO TRANSFUSE    Crossmatch Result Compatible    Unit Number [id number]    Blood Component Type RED CELLS,LR    Unit division 00    Status of Unit REL FROM [facility]    Transfusion Status OK TO TRANSFUSE     Crossmatch Result Compatible    Unit Number [id number]    Blood Component Type RED CELLS,LR    Unit division 00    Status of Unit ISSUED  Transfusion Status OK TO TRANSFUSE    Crossmatch Result      Compatible Performed at [facility], [address] [place], [place] [address]   hCG, serum, qualitative     Status: None   Collection Time: 04/03/24  9:25 PM  Result Value Ref Range   Preg, Serum NEGATIVE NEGATIVE  CBC with Differential/Platelet     Status: Abnormal   Collection Time: 04/03/24  9:25 PM  Result Value Ref Range   WBC 10.7 (H) 4.0 - 10.5 K/uL   RBC 2.31 (L) 3.87 - 5.11 MIL/uL   Hemoglobin 5.5 (LL) 12.0 - 15.0 g/dL   HCT 80.7 (L) 63.9 - 53.9 %   MCV 83.1 80.0 - 100.0 fL   MCH 23.8 (L) 26.0 - 34.0 pg   MCHC 28.6 (L) 30.0 - 36.0 g/dL   RDW 82.3 (H) 88.4 - 84.4 %   Platelets 379 150 - 400 K/uL   nRBC 0.0 0.0 - 0.2 %   Neutrophils Relative % 69 %   Neutro Abs 7.5 1.7 - 7.7 K/uL   Lymphocytes Relative 23 %   Lymphs Abs 2.5 0.7 - 4.0 K/uL   Monocytes Relative 6 %   Monocytes Absolute 0.7 0.1 - 1.0 K/uL   Eosinophils Relative 1 %   Eosinophils Absolute 0.1 0.0 - 0.5 K/uL   Basophils Relative 1 %   Basophils Absolute 0.1 0.0 - 0.1 K/uL   Immature Granulocytes 0 %   Abs Immature Granulocytes 0.03 0.00 - 0.07 K/uL  I-stat chem 8, ED (not at [facility], [facility] or [facility])     Status: Abnormal   Collection Time: 04/03/24  9:34 PM  Result Value Ref Range   Sodium 137 135 - 145 mmol/L   Potassium 3.5 3.5 - 5.1 mmol/L   Chloride 105 98 - 111 mmol/L   BUN 7 6 - 20 mg/dL   Creatinine, Ser 9.39 0.44 - 1.00 mg/dL   Glucose, Bld 896 (H) 70 - 99 mg/dL   Calcium, Ion 8.86 (L) 1.15 - 1.40 mmol/L   TCO2 22 22 - 32 mmol/L   Hemoglobin 6.5 (LL) 12.0 - 15.0 g/dL   HCT 80.9 (L) 63.9 - 53.9 %   Comment NOTIFIED PHYSICIAN   Prepare RBC (crossmatch)     Status: None   Collection Time: 04/03/24  9:40 PM  Result Value Ref Range   Order Confirmation      ORDER PROCESSED BY BLOOD  BANK Performed at [facility], [address] [place], [place] [address]     No results found.  Assessment/Plan: 33yo with severe, symptomatic anemia secondary to HMB, LLE DVT -2u PRBCs ordered in ED but not yet running -Will restart norethindrone  10 mg every day -Plan made with primary to refer to [facility] at [facility] for possible hysterectomy -Hospitalist consulted to assist with fluids and anticoagulation -LLE DVT-Today is last day of 10 mg BID Eliquis ; tomorrow 5 mg BID -Post-tx labs in AM  [facility] Andraya Frigon 04/03/2024, 11:02 PM

## 2024-04-03 NOTE — ED Triage Notes (Signed)
 PT arrives via POV. PT reports she has been having vaginal bleeding since May. She reports she was prescribed a medication to help with the bleeding then she developed a blood clot in her leg. Pt was started on eliquis  this past Thursday for the the DVT. Pt reports vaginal bleeding has worsened. Pt is pale during triage and tachycardic. She is AxOx4.

## 2024-04-04 ENCOUNTER — Other Ambulatory Visit: Payer: Self-pay

## 2024-04-04 ENCOUNTER — Observation Stay (HOSPITAL_BASED_OUTPATIENT_CLINIC_OR_DEPARTMENT_OTHER)

## 2024-04-04 DIAGNOSIS — R609 Edema, unspecified: Secondary | ICD-10-CM

## 2024-04-04 DIAGNOSIS — N920 Excessive and frequent menstruation with regular cycle: Secondary | ICD-10-CM | POA: Diagnosis not present

## 2024-04-04 DIAGNOSIS — D6489 Other specified anemias: Secondary | ICD-10-CM | POA: Diagnosis not present

## 2024-04-04 DIAGNOSIS — I82452 Acute embolism and thrombosis of left peroneal vein: Secondary | ICD-10-CM | POA: Diagnosis not present

## 2024-04-04 DIAGNOSIS — I824Z2 Acute embolism and thrombosis of unspecified deep veins of left distal lower extremity: Secondary | ICD-10-CM | POA: Diagnosis present

## 2024-04-04 DIAGNOSIS — N921 Excessive and frequent menstruation with irregular cycle: Secondary | ICD-10-CM | POA: Diagnosis not present

## 2024-04-04 LAB — COMPREHENSIVE METABOLIC PANEL WITH GFR
ALT: 13 U/L (ref 0–44)
AST: 15 U/L (ref 15–41)
Albumin: 2.7 g/dL — ABNORMAL LOW (ref 3.5–5.0)
Alkaline Phosphatase: 51 U/L (ref 38–126)
Anion gap: 10 (ref 5–15)
BUN: 6 mg/dL (ref 6–20)
CO2: 22 mmol/L (ref 22–32)
Calcium: 8.3 mg/dL — ABNORMAL LOW (ref 8.9–10.3)
Chloride: 106 mmol/L (ref 98–111)
Creatinine, Ser: 0.69 mg/dL (ref 0.44–1.00)
GFR, Estimated: >60 mL/min (ref >60)
Glucose, Bld: 132 mg/dL — ABNORMAL HIGH (ref 70–99)
Potassium: 3.1 mmol/L — ABNORMAL LOW (ref 3.5–5.1)
Sodium: 138 mmol/L (ref 135–145)
Total Bilirubin: 1.1 mg/dL (ref 0.0–1.2)
Total Protein: 5.7 g/dL — ABNORMAL LOW (ref 6.5–8.1)

## 2024-04-04 LAB — CBC
HCT: 21 % — ABNORMAL LOW (ref 36.0–46.0)
HCT: 22.9 % — ABNORMAL LOW (ref 36.0–46.0)
Hemoglobin: 6.5 g/dL — CL (ref 12.0–15.0)
Hemoglobin: 7.3 g/dL — ABNORMAL LOW (ref 12.0–15.0)
MCH: 25.3 pg — ABNORMAL LOW (ref 26.0–34.0)
MCH: 26.4 pg (ref 26.0–34.0)
MCHC: 31 g/dL (ref 30.0–36.0)
MCHC: 31.9 g/dL (ref 30.0–36.0)
MCV: 81.7 fL (ref 80.0–100.0)
MCV: 83 fL (ref 80.0–100.0)
Platelets: 340 K/uL (ref 150–400)
Platelets: 340 K/uL (ref 150–400)
RBC: 2.57 MIL/uL — ABNORMAL LOW (ref 3.87–5.11)
RBC: 2.76 MIL/uL — ABNORMAL LOW (ref 3.87–5.11)
RDW: 16.9 % — ABNORMAL HIGH (ref 11.5–15.5)
RDW: 16.7 % — ABNORMAL HIGH (ref 11.5–15.5)
WBC: 10.9 K/uL — ABNORMAL HIGH (ref 4.0–10.5)
WBC: 12.3 K/uL — ABNORMAL HIGH (ref 4.0–10.5)
nRBC: 0.3 % — ABNORMAL HIGH (ref 0.0–0.2)
nRBC: 0.3 % — ABNORMAL HIGH (ref 0.0–0.2)

## 2024-04-04 LAB — PREPARE RBC (CROSSMATCH)

## 2024-04-04 LAB — TROPONIN I (HIGH SENSITIVITY): Troponin I (High Sensitivity): 4 ng/L (ref <18)

## 2024-04-04 LAB — PREGNANCY, URINE: Preg Test, Ur: NEGATIVE

## 2024-04-04 MED ORDER — CYANOCOBALAMIN 1000 MCG/ML IJ SOLN
1000.0000 ug | Freq: Every day | INTRAMUSCULAR | Status: DC
  Administered 2024-04-04 – 2024-04-05 (×2): 1000 ug via SUBCUTANEOUS
  Filled 2024-04-04 (×2): qty 1

## 2024-04-04 MED ORDER — SODIUM CHLORIDE 0.9 % IV SOLN
400.0000 mg | Freq: Once | INTRAVENOUS | Status: AC
  Administered 2024-04-04: 400 mg via INTRAVENOUS
  Filled 2024-04-04: qty 20

## 2024-04-04 MED ORDER — FOLIC ACID 1 MG PO TABS
1.0000 mg | ORAL_TABLET | Freq: Every day | ORAL | Status: DC
  Administered 2024-04-04: 1 mg via ORAL
  Filled 2024-04-04: qty 1

## 2024-04-04 MED ORDER — LACTATED RINGERS IV BOLUS
1000.0000 mL | Freq: Once | INTRAVENOUS | Status: AC
  Administered 2024-04-04: 1000 mL via INTRAVENOUS

## 2024-04-04 MED ORDER — IRON SUCROSE 400 MG IVPB - SIMPLE MED
400.0000 mg | Freq: Once | Status: DC
  Filled 2024-04-04: qty 270

## 2024-04-04 MED ORDER — POTASSIUM CHLORIDE CRYS ER 20 MEQ PO TBCR
40.0000 meq | EXTENDED_RELEASE_TABLET | Freq: Once | ORAL | Status: AC
  Administered 2024-04-04: 40 meq via ORAL
  Filled 2024-04-04: qty 2

## 2024-04-04 MED ORDER — SODIUM CHLORIDE 0.9% IV SOLUTION: Freq: Once | INTRAVENOUS | Status: AC

## 2024-04-04 MED ORDER — VITAMIN B-12 1000 MCG PO TABS: 1000.0000 ug | ORAL_TABLET | Freq: Every day | ORAL | Status: DC

## 2024-04-04 MED ORDER — SODIUM CHLORIDE 0.9% FLUSH: 10.0000 mL | INTRAVENOUS | Status: DC | PRN

## 2024-04-04 MED ORDER — BUSPIRONE HCL 5 MG PO TABS
7.5000 mg | ORAL_TABLET | Freq: Two times a day (BID) | ORAL | Status: DC
  Administered 2024-04-04 (×2): 7.5 mg via ORAL
  Filled 2024-04-04 (×2): qty 2

## 2024-04-04 MED ORDER — ALPRAZOLAM 0.25 MG PO TABS: 0.2500 mg | ORAL_TABLET | Freq: Two times a day (BID) | ORAL | Status: DC | PRN

## 2024-04-04 NOTE — Progress Notes (Signed)
 GYN Progress Note  S: Feeling better, bleeding has stabilized off the anticoagulation.  O:      04/04/2024    8:01 AM 04/04/2024    4:38 AM 04/04/2024    4:15 AM  Vitals with BMI  Systolic 139 128 877  Diastolic 71 75 68  Pulse 94 94 95   Gen: Lying in bed, no acute distress Cv: reg rate Pulm: NWOB Abd: soft, nondistended Ext: no edema b/l  A/P: 33yo admitted for symptomatic anemia secondary to HMB in the setting of recently diagnosed LLE DVT (was on eliquis  10mg  BID prior to admission).  # HMB S/p 2u pRBCs, feeling symptomatically improved but still fatigued > posttransfusion CBC pending. HR and BP normalized. Progesterone restarted today Continue pad counts She has appointment scheduled at [facility] 8/28 for definitive management with hysterectomy Discussed case with Dr. Tobie [facility]) - initial plan was to transition to lovenox , however in setting of acute bleeding, we discussed consideration of possible IVC filter to bridge her to surgery. Dr. Tobie to discuss with heme/vascular surgery re efficacy.  Dispo: pending hematology and vascular evaluation  Slater Door, MD

## 2024-04-04 NOTE — Progress Notes (Signed)
 [facility] Consultation Progress Note  Patient: Alexandra Newton FMW:[id number]   PCP: Waylan Almarie SAUNDERS, MD DOB: 1990-10-25   DOA: 04/03/2024   DOS: 04/04/2024   Date of Service: the patient was seen and examined on 04/04/2024 Primary service: Dannielle Bouchard, DO   Brief Hospital Course: Patient with PMH of menorrhagia with dysfunctional uterine bleeding, with history of D&C and polypectomy.  Chronic iron  deficiency anemia, morbid obesity with history of gastric bypass presented to hospital with complaints of fatigue and tiredness and worsening of her anemia. Patient has chronic anemia secondary to heavy menstrual bleeding. Patient sees hematology outpatient for iron  infusion. 8/13 patient was seen in the hospital for left leg cramp.  Workup back then showed evidence of left peroneal vein DVT as well as left gastrocnemius vein DVT.  CT PE protocol was negative for PE. Patient was admitted to the hospital.  Started on Eliquis .  Was discharged home. For her heavy menstrual bleeding patient was on norethindrone  which was stopped at the time of the discharge. Patient continues to have heavy menstrual bleeding after initiation of anticoagulation and went to see her GYN due to fatigue and tiredness. She reports that she will change her pads with a tampon 3-4 times every hour. With this hemoglobin was 5.5 at the time of admission. Patient was admitted for blood transfusion and further management. Hospitalist service was consulted for further medical management.  Assessment and plan. Symptomatic anemia. Chronic iron  deficiency as well as folic acid  deficiency and vitamin B12 deficiency. Hemoglobin 5.5 on admission. Patient received 2 PRBC transfusion and hemoglobin improved to 6.5. Not adequate rise. Receiving another PRBC transfusion right now. 03/28/2024-iron  13, folate 12.9, B12 194. Will order 1 dose of Venofer  400 mg.  Followed by daily iron  supplementation. Will order folic acid  as  well. Will order B12 injection.  Given history of gastric bypass surgery, she may benefit from weekly IM injection. Appreciate vascular surgery as well as hematology input.  Currently recommendation for no anticoagulation from vascular surgery given distal DVT.  Anticipate stabilization of the hemoglobin with holding anticoagulation.  Left leg distal DVT. Risk factor includes, norethindrone  use, obesity, fatigue and sedentary secondary to anemia Do not see these risk factors being removed and thus patient remains at risk for further worsening. Patient was started on Eliquis  during recent admission. Now presents with severe bleeding secondary to anticoagulation. Bleeding appears to be improving after stopping the anticoagulation. I discussed with hematology.  Consulted vascular surgery for possible consideration for filter placement due to concern for active bleeding. Vascular surgery currently recommends conservative measures as per patient preference without any anticoagulation and serial ultrasounds and aspirin  only.  Heavy menstrual bleeding. Management per GYN. Patient is referred to [facility] for possible hysterectomy. At present to be at risk for further bleeding as well as remained at risk for worsening of DVT due to the above.  Class III obesity Body mass index is 54.82 kg/m.  Status post gastric sleeve surgery. Patient is suffering from B12 and iron  deficiency. Recommend IV and IM replacement of same outpatient. BMI still significantly elevated, recommend outpatient follow-up with PCP for further treatment.  Mood disorder. Resuming Xanax  and BuSpar .  Hypokalemia. Replaced.  We will continue to follow the patient.    Subjective: Patient reports that she used tampons and pads 3 to 4/h yesterday and today she has only changed once.  Denies any abdominal pain.  Still reports fatigue and tiredness as well as shortness of breath.  No dizziness no  lightheadedness.  No bleeding  anywhere  Objective: Vitals:   04/04/24 0801 04/04/24 1139 04/04/24 1514 04/04/24 1532  BP: 139/71 (!) 140/77 120/66 (!) 117/57  Pulse: 94 82 78 77  Resp: 18 17 18 18   Temp: 98.3 F (36.8 C) 98.2 F (36.8 C) 98.1 F (36.7 C) 98.3 F (36.8 C)  TempSrc: Oral Oral Oral Oral  SpO2: 100% 99% 100% 100%  Weight:      Height:        Clear to auscultation. S1-S2 present bronchial bowel sound present. No edema. No abdominal tenderness.  Family Communication: No one at bedside  Data Reviewed: Since last encounter, pertinent lab results CBC and BMP   . I have ordered test including CBC and BMP  . I have discussed pt's care plan and test results with GYN, hematology, vascular surgery  .   Author: Yetta Blanch, MD  [facility] 04/04/2024  5:15 PM  To reach On-call, Look up on care teams to locate the [facility] team or provider name and reach out to them via secure chat or [email or web address] Between 7PM-7AM, please contact night-coverage. If you still have difficulty reaching the attending provider, please page the [facility] (Director on Call) for [facility] on amion for assistance.

## 2024-04-04 NOTE — Progress Notes (Signed)
 LLE venous exam is completed. Rebbeca Sheperd, RVT

## 2024-04-04 NOTE — Consult Note (Addendum)
 Hospital Consult    Reason for Consult:  IVC filter placement Requesting Physician:  Duwaine Blumenthal, DO MRN #:  [id number]  History of Present Illness: This is a 33 y.o. female with past medical history significant for Anemia,Depression/ anxiety, morbid obesity, endometrial thickening, admitted with recurrent heavy menstrual bleeding and symptomatic anemia. She was recently hospitalized with same complaint and additionally was diagnosed with a LLE DVT in the peroneal and gastrocnemius veins. She was started on Eliquis . She has been on Norethindrone  for management of her HMB. She is scheduled to see [facility] next week for definitive management with hysterectomy. Her Eliquis  has been held and she has had improvement of her bleeding. She presently denies any pain in her leg.  Vascular surgery was consulted for possible IVC filter placement.  Past Medical History:  Diagnosis Date   Allergy    seasonal   Anxiety    Anxiety    Borderline anemia    pt had 2 iron  infusions in March/April 2025   Depression    Depression    Phreesia 10/11/2020   History of bronchitis as a child    Panic attacks     Past Surgical History:  Procedure Laterality Date   HYSTEROSCOPY WITH D & C N/A 12/19/2023   Procedure: DILATATION AND CURETTAGE /HYSTEROSCOPY;  Surgeon: Laurence Slater PARAS, MD;  Location: [facility] OR;  Service: Gynecology;  Laterality: N/A;  POSSIBLE MYOSURE LITE   INTRAUTERINE DEVICE (IUD) INSERTION N/A 12/19/2023   Procedure: INSERTION, INTRAUTERINE DEVICE;  Surgeon: Laurence Slater PARAS, MD;  Location: [facility] OR;  Service: Gynecology;  Laterality: N/A;  MIRENA    LAPAROSCOPIC GASTRIC SLEEVE RESECTION N/A 02/22/2016   Procedure: LAPAROSCOPIC GASTRIC SLEEVE RESECTION WITH UPPER ENDO;  Surgeon: Morene Olives, MD;  Location: [facility] ORS;  Service: General;  Laterality: N/AMERL NAIL RESECTION  12/19/2023   Procedure: NAIL RESECTION;  Surgeon: Laurence Slater PARAS, MD;  Location: [facility] OR;  Service: Gynecology;;   WISDOM TOOTH EXTRACTION      4    Allergies  Allergen Reactions   Chocolate Other (See Comments)    Inflammation (allergy testing completed)   Pork-Derived Products     Inflammation     Prior to Admission medications   Medication Sig Start Date End Date Taking? Authorizing Provider  acetaminophen  (TYLENOL ) 500 MG tablet Take 1 tablet (500 mg total) by mouth every 6 (six) hours as needed. Patient taking differently: Take 1,000 mg by mouth every 6 (six) hours as needed for mild pain (pain score 1-3). 12/19/23  Yes Laurence Slater PARAS, MD  ALPRAZolam  (XANAX ) 0.25 MG tablet Take 0.25 mg by mouth 2 (two) times daily as needed for anxiety. 10/02/23  Yes [provider]  APIXABAN  (ELIQUIS ) VTE STARTER PACK (10MG  AND 5MG ) Take as directed on package: start with two-5mg  tablets twice daily for 7 days. On day 8, switch to one-5mg  tablet twice daily. 03/28/24  Yes Drusilla Sabas RAMAN, MD  busPIRone  (BUSPAR ) 7.5 MG tablet Take 1 tablet (7.5 mg total) by mouth 2 (two) times daily. For anxiety Patient taking differently: Take 3.75-7.5 mg by mouth in the morning and at bedtime. Take one tablet (7.5mg ) in the morning and take one-half tablet (3.75mg ) 09/23/20  Yes [place], Theodoro FALCON, MD  Cholecalciferol (VITAMIN D3) 125 MCG (5000 UT) TABS Take 5,000 Units by mouth at bedtime.   Yes [provider]  ferrous sulfate  325 (65 FE) MG EC tablet Take 1 tablet (325 mg total) by mouth every other day. Patient taking  differently: Take 325 mg by mouth 2 (two) times daily. 11/14/23  Yes Davonna Siad, MD  Multiple Vitamins-Minerals (AIRBORNE ELDERBERRY) CHEW Chew 1 capsule by mouth every other day.   Yes [provider]  norethindrone  (GALLIFREY ) 5 MG tablet Take 15 mg by mouth daily.   Yes [provider]    Social History   Socioeconomic History   Marital status: Single    Spouse name: Not on file   Number of children: Not on file   Years of education: Not on file   Highest education level: Not on file  Occupational  History   Not on file  Tobacco Use   Smoking status: Never   Smokeless tobacco: Never  Vaping Use   Vaping status: Never Used  Substance and Sexual Activity   Alcohol use: Yes    Comment: socially, maybe once or twice a month   Drug use: No   Sexual activity: Never  Other Topics Concern   Not on file  Social History Narrative   Not on file   Social Drivers of Health   Financial Resource Strain: Not on file  Food Insecurity: No Food Insecurity (04/04/2024)   Hunger Vital Sign    Worried About Running Out of Food in the Last Year: Never true    Ran Out of Food in the Last Year: Never true  Transportation Needs: No Transportation Needs (04/04/2024)   PRAPARE - Administrator, Civil Service (Medical): No    Lack of Transportation (Non-Medical): No  Physical Activity: Not on file  Stress: Not on file  Social Connections: Not on file  Intimate Partner Violence: Not At Risk (04/04/2024)   Humiliation, Afraid, Rape, and Kick questionnaire    Fear of Current or Ex-Partner: No    Emotionally Abused: No    Physically Abused: No    Sexually Abused: No    Family History  Problem Relation Age of Onset   Arthritis Mother    Cancer Mother        ovarian   Depression Mother    Diabetes Mother    Hypertension Mother    Obesity Mother    Alcohol abuse Father    Arthritis Father    Depression Father    Arthritis Sister    Depression Sister    Early death Maternal Grandmother    Obesity Maternal Grandmother    Cancer Maternal Grandmother        Lung    Cancer Maternal Grandfather        prostate   Diabetes Maternal Grandfather    Hypertension Maternal Grandfather    Arthritis Paternal Grandmother    Depression Paternal Grandmother    Diabetes Paternal Grandmother    Hypertension Paternal Grandmother    Hyperlipidemia Paternal Grandfather    Stroke Paternal Grandfather    Cancer Maternal Aunt        breast   Depression Maternal Aunt     ROS: Otherwise  negative unless mentioned in HPI  Physical Examination  Vitals:   04/04/24 0801 04/04/24 1139  BP: 139/71 (!) 140/77  Pulse: 94 82  Resp: 18 17  Temp: 98.3 F (36.8 C) 98.2 F (36.8 C)  SpO2: 100% 99%   Body mass index is 54.82 kg/m.  General: obese young female, in no acute distress Gait: Not observed HENT: WNL, normocephalic Pulmonary: normal non-labored breathing Cardiac: regular Vascular Exam/Pulses: BLE well perfused and warm without edema Musculoskeletal: no muscle wasting or atrophy  Neurologic: A&O X  3;  No focal weakness or paresthesias are detected; speech is fluent/normal Psychiatric:  The pt has Normal affect.  CBC    Component Value Date/Time   WBC 10.9 (H) 04/04/2024 0941   RBC 2.57 (L) 04/04/2024 0941   HGB 6.5 (LL) 04/04/2024 0941   HCT 21.0 (L) 04/04/2024 0941   PLT 340 04/04/2024 0941   MCV 81.7 04/04/2024 0941   MCH 25.3 (L) 04/04/2024 0941   MCHC 31.0 04/04/2024 0941   RDW 16.9 (H) 04/04/2024 0941   LYMPHSABS 2.5 04/03/2024 2125   MONOABS 0.7 04/03/2024 2125   EOSABS 0.1 04/03/2024 2125   BASOSABS 0.1 04/03/2024 2125    BMET    Component Value Date/Time   NA 138 04/04/2024 0941   K 3.1 (L) 04/04/2024 0941   CL 106 04/04/2024 0941   CO2 22 04/04/2024 0941   GLUCOSE 132 (H) 04/04/2024 0941   BUN 6 04/04/2024 0941   CREATININE 0.69 04/04/2024 0941   CREATININE 0.48 (L) 01/09/2018 1210   CALCIUM 8.3 (L) 04/04/2024 0941   GFRNONAA >60 04/04/2024 0941   GFRAA >60 02/19/2016 1430    COAGS: Lab Results  Component Value Date   INR 1.1 03/28/2024     Non-Invasive Vascular Imaging:   VAS US  Lower extremity venous (DVT): 03/28/24 Summary:  RIGHT:  - No evidence of common femoral vein obstruction.  LEFT:  - Findings consistent with acute deep vein thrombosis involving the left peroneal veins.  Findings consistent with acute intramuscular thrombosis involving the left gastrocnemius veins.   VAS US  Lower extremity venous (DVT):  04/04/24 LEFT:  - Findings consistent with acute deep vein thrombosis involving the left peroneal veins.  - Persistent acute appearing deep vein thrombosis in 1 of 2 peroneal veins.    Statin:  No. Beta Blocker:  No. Aspirin :  No. ACEI:  No. ARB:  No. CCB use:  No Other antiplatelets/anticoagulants:  Yes.   Eliquis    ASSESSMENT/PLAN: This is a 33 y.o. female admitted with recurrent HMB with anemia. She is hemodynamically stable s/p transfusion of 2 units PRBC. She was recently diagnosed with a LLE DVT during her last admission for same complaint. She was started on Eliquis , however this caused worsening bleeding issues. She is anticipating definitive management with hysterectomy next week at [facility]. Vascular surgery was consulted for placement of an IVC filter. On repeat DVT duplex today she has had some resolution of her DVT. She now only has thrombus in one of her paired peroneal veins. Patient seen with Dr. Magda. Generally we would not recommend anticoagulation or placement of IVC filter in setting of low risk peroneal vein DVT. However, we discussed with patient that IVC filter could be placed as she is somewhat at risk pending future surgery and current use of Progesterone. However patient really wants most conservative therapy and would prefer medical management. We discussed full dose Aspirin  and serial DVT studies as alternative option. She would prefer this route. We can arrange outpatient DVT follow up in our [place] office. We will also have her follow up in the outpatient DVT clinic with our PharmD Lum Herald.    Teretha Damme PA-C [facility] [phone] 04/04/2024  1:53 PM  VASCULAR STAFF ADDENDUM: I have independently interviewed and examined the patient. I agree with the above.  Low risk calf vein DVT stable on serial exams despite interruption of anticoagulation. Asked to evaluate for IVC filter. Patient prefers to avoid this if  possible. Recommend aspirin  325mg  by mouth daily.  Check venous duplex weekly until she is able to undergo hysterectomy, or reliably start anticoagulation.  Chemical DVT treatment should resume as soon as safe post hysterectomy.  Debby SAILOR. Magda, MD [facility] Phone Number: [phone] 04/04/2024 5:59 PM

## 2024-04-04 NOTE — Progress Notes (Incomplete)
 [facility] at [facility]  HEMATOLOGY NEW VISIT  Waylan Almarie SAUNDERS, MD  REASON FOR REFERRAL: ***  SUMMARY OF HEMATOLOGIC HISTORY: ***   HISTORY OF PRESENT ILLNESS: Alexandra Newton 33 y.o. female referred for ***. The patient is accompanied by    I have reviewed the past medical history, past surgical history, social history and family history with the patient   ALLERGIES:  is allergic to chocolate and pork-derived products.  MEDICATIONS:  No current facility-administered medications for this visit.   No current outpatient medications on file.   Facility-Administered Medications Ordered in Other Visits  Medication Dose Route Frequency Provider Last Rate Last Admin   0.9 %  sodium chloride  infusion (Manually program via Guardrails IV Fluids)   Intravenous Once Henderly, Britni A, PA-C       0.9 %  sodium chloride  infusion (Manually program via Guardrails IV Fluids)   Intravenous Once Patel, Pranav M, MD       acetaminophen  (TYLENOL ) tablet 650 mg  650 mg Oral Q4H PRN Morris, Megan, DO       ALPRAZolam  (XANAX ) tablet 0.25 mg  0.25 mg Oral BID PRN Patel, Pranav M, MD       busPIRone  (BUSPAR ) tablet 7.5 mg  7.5 mg Oral BID Patel, Pranav M, MD   7.5 mg at 04/04/24 1018   cyanocobalamin  (VITAMIN B12) injection 1,000 mcg  1,000 mcg Subcutaneous Q0600 Patel, Pranav M, MD   1,000 mcg at 04/04/24 1015   Followed by   NOREEN ON 04/07/2024] cyanocobalamin  (VITAMIN B12) tablet 1,000 mcg  1,000 mcg Oral Daily Patel, Pranav M, MD       folic acid  (FOLVITE ) tablet 1 mg  1 mg Oral Daily Patel, Pranav M, MD   1 mg at 04/04/24 1018   iron  sucrose (VENOFER ) 400 mg in sodium chloride  0.9 % 250 mL IVPB  400 mg Intravenous Once Reome, Earle J, RPH 108 mL/hr at 04/04/24 1158 400 mg at 04/04/24 1158   norethindrone  (AYGESTIN ) tablet 10 mg  10 mg Oral Daily Morris, Megan, DO   10 mg at 04/04/24 1019   prenatal multivitamin tablet 1 tablet  1 tablet Oral Q1200 Morris, Megan, DO   1  tablet at 04/04/24 1321   sodium chloride  flush (NS) 0.9 % injection 10-40 mL  10-40 mL Intracatheter PRN Dannielle Bouchard, DO         REVIEW OF SYSTEMS:   Constitutional: Denies fevers, chills or night sweats Eyes: Denies blurriness of vision Ears, nose, mouth, throat, and face: Denies mucositis or sore throat Respiratory: Denies cough, dyspnea or wheezes Cardiovascular: Denies palpitation, chest discomfort or lower extremity swelling Gastrointestinal:  Denies nausea, heartburn or change in bowel habits Skin: Denies abnormal skin rashes Lymphatics: Denies new lymphadenopathy or easy bruising Neurological:Denies numbness, tingling or new weaknesses Behavioral/Psych: Mood is stable, no new changes  All other systems were reviewed with the patient and are negative.  PHYSICAL EXAMINATION:   There were no vitals filed for this visit.  GENERAL:alert, no distress and comfortable SKIN: skin color, texture, turgor are normal, no rashes or significant lesions EYES: normal, Conjunctiva are pink and non-injected, sclera clear OROPHARYNX:no exudate, no erythema and lips, buccal mucosa, and tongue normal  NECK: supple, thyroid  normal size, non-tender, without nodularity LYMPH:  no palpable lymphadenopathy in the cervical, axillary or inguinal LUNGS: clear to auscultation and percussion with normal breathing effort HEART: regular rate & rhythm and no murmurs and no lower extremity edema ABDOMEN:abdomen soft,  non-tender and normal bowel sounds Musculoskeletal:no cyanosis of digits and no clubbing  NEURO: alert & oriented x 3 with fluent speech, no focal motor/sensory deficits  LABORATORY DATA:  I have reviewed the data as listed  Lab Results  Component Value Date   WBC 10.9 (H) 04/04/2024   NEUTROABS 7.5 04/03/2024   HGB 6.5 (LL) 04/04/2024   HCT 21.0 (L) 04/04/2024   MCV 81.7 04/04/2024   PLT 340 04/04/2024    No results found for: SPEP, UPEP   @LASTCHEMISTRY @  RADIOGRAPHIC  STUDIES: I have personally reviewed the radiological images as listed and agreed with the findings in the report. VAS US  LOWER EXTREMITY VENOUS (DVT) (7a-7p)  Lower Venous DVT Study  Patient Name:  Alexandra Newton  Date of Exam:   03/28/2024 Medical Rec #: [id number]    Accession #:    [id number] Date of Birth: May 11, 1991     Patient Gender: F Patient Age:   16 years Exam Location:  [facility] Procedure:      VAS US  LOWER EXTREMITY VENOUS (DVT) Referring Phys: BLEASE DOUTOVA  --------------------------------------------------------------------------------   Indications: Swelling.   Risk Factors: None identified. Limitations: Body habitus and poor ultrasound/tissue interface. Comparison Study: No prior studies.  Performing Technologist: Cordella Collet RVT    Examination Guidelines: A complete evaluation includes B-mode imaging, spectral Doppler, color Doppler, and power Doppler as needed of all accessible portions of each vessel. Bilateral testing is considered an integral part of a complete examination. Limited examinations for reoccurring indications may be performed as noted. The reflux portion of the exam is performed with the patient in reverse Trendelenburg.     +-----+---------------+---------+-----------+----------+--------------+ RIGHTCompressibilityPhasicitySpontaneityPropertiesThrombus Aging +-----+---------------+---------+-----------+----------+--------------+ CFV  Full           Yes      Yes                                 +-----+---------------+---------+-----------+----------+--------------+        +---------+---------------+---------+-----------+----------+--------------+ LEFT     CompressibilityPhasicitySpontaneityPropertiesThrombus Aging +---------+---------------+---------+-----------+----------+--------------+ CFV      Full           Yes      Yes                                  +---------+---------------+---------+-----------+----------+--------------+ SFJ      Full                                                        +---------+---------------+---------+-----------+----------+--------------+ FV Prox  Full                                                        +---------+---------------+---------+-----------+----------+--------------+ FV Mid   Full                                                        +---------+---------------+---------+-----------+----------+--------------+ FV  DistalFull                                                        +---------+---------------+---------+-----------+----------+--------------+ PFV      Full                                                        +---------+---------------+---------+-----------+----------+--------------+ POP      Full           Yes      Yes                                 +---------+---------------+---------+-----------+----------+--------------+ PTV      Full                                                        +---------+---------------+---------+-----------+----------+--------------+ PERO     Partial                                      Acute          +---------+---------------+---------+-----------+----------+--------------+ Gastroc  None                                         Acute          +---------+---------------+---------+-----------+----------+--------------+          Summary: RIGHT: - No evidence of common femoral vein obstruction.      LEFT: - Findings consistent with acute deep vein thrombosis involving the left peroneal veins. Findings consistent with acute intramuscular thrombosis involving the left gastrocnemius veins.     *See table(s) above for measurements and observations.  Electronically signed by Gaile New MD on 03/28/2024 at 7:13:01 PM.      Final   CT Angio Chest PE W and/or Wo  Contrast CLINICAL DATA:  Elevated D-dimer and shortness of breath, initial encounter  EXAM: CT ANGIOGRAPHY CHEST WITH CONTRAST  TECHNIQUE: Multidetector CT imaging of the chest was performed using the standard protocol during bolus administration of intravenous contrast. Multiplanar CT image reconstructions and MIPs were obtained to evaluate the vascular anatomy.  RADIATION DOSE REDUCTION: This exam was performed according to the departmental dose-optimization program which includes automated exposure control, adjustment of the mA and/or kV according to patient size and/or use of iterative reconstruction technique.  CONTRAST:  75mL OMNIPAQUE  IOHEXOL  350 MG/ML SOLN  COMPARISON:  Chest x-ray from the previous day.  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 No aneurysmal dilatation or dissection is seen. The heart is mildly enlarged in size. The pulmonary artery shows no evidence of pulmonary emboli.  Mediastinum/Nodes: Thoracic inlet is within normal limits. No hilar or mediastinal adenopathy is noted. The esophagus as visualized is within normal limits.  Lungs/Pleura: Lungs are clear. No pleural effusion or pneumothorax.  Upper Abdomen: Visualized upper abdomen is within normal limits.  Musculoskeletal: Mild degenerative changes of the thoracic spine are noted. No acute abnormality noted.  Review of the MIP images confirms the above findings.  IMPRESSION: No evidence of pulmonary emboli.  No acute abnormality seen.  Electronically Signed   By: Oneil Devonshire M.D.   On: 03/28/2024 02:28   ASSESSMENT & PLAN:  Patient is a 33 y.o. female referred for ***  Assessment & Plan    No orders of the defined types were placed in this encounter.   The total time spent in the appointment was {CHL ONC TIME VISIT - DTPQU:[id number]} encounter with patients including review of chart and various tests results, discussions  about plan of care and coordination of care plan   All questions were answered. The patient knows to call the clinic with any problems, questions or concerns. No barriers to learning was detected.   Alexandra Newton 8/21/20252:06 PM

## 2024-04-04 NOTE — Plan of Care (Signed)
  Problem: Education: Goal: Knowledge of General Education information will improve Description: Including pain rating scale, medication(s)/side effects and non-pharmacologic comfort measures Outcome: Progressing   Problem: Clinical Measurements: Goal: Diagnostic test results will improve Outcome: Progressing   Problem: Safety: Goal: Ability to remain free from injury will improve Outcome: Progressing   Problem: Skin Integrity: Goal: Risk for impaired skin integrity will decrease Outcome: Progressing

## 2024-04-05 ENCOUNTER — Inpatient Hospital Stay

## 2024-04-05 ENCOUNTER — Other Ambulatory Visit: Payer: Self-pay

## 2024-04-05 ENCOUNTER — Inpatient Hospital Stay: Attending: Oncology | Admitting: Oncology

## 2024-04-05 DIAGNOSIS — I824Z2 Acute embolism and thrombosis of unspecified deep veins of left distal lower extremity: Secondary | ICD-10-CM

## 2024-04-05 LAB — TYPE AND SCREEN
ABO/RH(D): A POS
Antibody Screen: NEGATIVE
Unit division: 0
Unit division: 0
Unit division: 0
Unit division: 0
Unit division: 0
Unit division: 0

## 2024-04-05 LAB — BASIC METABOLIC PANEL WITH GFR
Anion gap: 7 (ref 5–15)
BUN: 6 mg/dL (ref 6–20)
CO2: 21 mmol/L — ABNORMAL LOW (ref 22–32)
Calcium: 8.2 mg/dL — ABNORMAL LOW (ref 8.9–10.3)
Chloride: 108 mmol/L (ref 98–111)
Creatinine, Ser: 0.67 mg/dL (ref 0.44–1.00)
GFR, Estimated: >60 mL/min (ref >60)
Glucose, Bld: 140 mg/dL — ABNORMAL HIGH (ref 70–99)
Potassium: 3.6 mmol/L (ref 3.5–5.1)
Sodium: 136 mmol/L (ref 135–145)

## 2024-04-05 LAB — BPAM RBC
Blood Product Expiration Date: 202509192359
Blood Product Unit Number: 202508292359
ISSUE DATE / TIME: 202508210331
ISSUE DATE / TIME: 202508210726
ISSUE DATE / TIME: 202508211504
ISSUE DATE / TIME: 202508222359
ISSUE DATE / TIME: 202508252359
ISSUE DATE / TIME: 202509202359
ISSUE DATE / TIME: 202508202221
ISSUE DATE / TIME: 202508220411
ISSUE DATE / TIME: 202509192359
Unit Type and Rh: 202508252359
Unit Type and Rh: 202509192359
Unit Type and Rh: 202509202359
Unit Type and Rh: 6200
Unit Type and Rh: 6200
Unit Type and Rh: 9500
Unit Type and Rh: 202508222359
Unit Type and Rh: 202508292359
Unit Type and Rh: 202509192359
Unit Type and Rh: 6200
Unit Type and Rh: 6200
Unit Type and Rh: 9500

## 2024-04-05 LAB — CBC
HCT: 23.5 % — ABNORMAL LOW (ref 36.0–46.0)
Hemoglobin: 7.2 g/dL — ABNORMAL LOW (ref 12.0–15.0)
MCH: 26.1 pg (ref 26.0–34.0)
MCHC: 30.6 g/dL (ref 30.0–36.0)
MCV: 85.1 fL (ref 80.0–100.0)
Platelets: 332 K/uL (ref 150–400)
RBC: 2.76 MIL/uL — ABNORMAL LOW (ref 3.87–5.11)
RDW: 16.8 % — ABNORMAL HIGH (ref 11.5–15.5)
WBC: 9.3 K/uL (ref 4.0–10.5)
nRBC: 0.5 % — ABNORMAL HIGH (ref 0.0–0.2)

## 2024-04-05 LAB — MAGNESIUM: Magnesium: 1.9 mg/dL (ref 1.7–2.4)

## 2024-04-05 MED ORDER — CYANOCOBALAMIN 1000 MCG PO TABS: 1000.0000 ug | ORAL_TABLET | Freq: Every day | ORAL | 0 refills | Status: AC

## 2024-04-05 MED ORDER — NORETHINDRONE ACETATE 5 MG PO TABS: 10.0000 mg | ORAL_TABLET | Freq: Every day | ORAL | 1 refills | Status: DC

## 2024-04-05 MED ORDER — ASPIRIN 325 MG PO TBEC: 325.0000 mg | DELAYED_RELEASE_TABLET | Freq: Every day | ORAL | 1 refills | Status: AC

## 2024-04-05 MED ORDER — PRENATAL MULTIVITAMIN CH: 1.0000 | ORAL_TABLET | Freq: Every day | ORAL | 1 refills | Status: AC

## 2024-04-05 MED ORDER — FOLIC ACID 1 MG PO TABS: 1.0000 mg | ORAL_TABLET | Freq: Every day | ORAL | 1 refills | Status: AC

## 2024-04-05 NOTE — Discharge Summary (Signed)
 Physician Discharge Summary  Patient ID: Alexandra Newton MRN: [id number] DOB/AGE: 33/01/1991 33 y.o.  Admit date: 04/03/2024 Discharge date: 04/05/2024  Admission Diagnoses: Acute blood loss anemia, clinically significant (symptomatic) AUB-HMB Acute LLE DVT  Discharge Diagnoses:  Principal Problem:   Menorrhagia Active Problems:   Acute deep vein thrombosis (DVT) of distal end of left lower extremity [facility])   Discharged Condition: Stable  Hospital Course:  Alexandra Newton was admitted to the hospital for symptomatic anemia in the setting of worsened AUB after starting Eliqiuis for new DVT diagnosis about a week ago. She received 2u pRBCs at that time and was diagnosed with LLE DVT, sent home on Eliquis  10mg  BID. Since then, her bleeding increased significantly. This admission, she received another 3u pRBCs due to symptomatic anemia, which has improved her symptoms. Her anticoagulation was discontinued on admission. Repeat LLE duplex with stable DVT. The hospitalist group was consulted for assistance with managing the acute VTE - hematology and vascular surgery were consulted by Dr. Tobie for consideration of IVC filter prior to planned hysterectomy. She was counseled regarding her options - plan per vascular: full dose aspirin , serial DVT studies at their office with outpatient DVT clinic follow up. She is scheduled to see [facility] on 8/28 for hysterectomy consult. I discussed strict return precautions with the patient. She was discharged home on aspirin  and oral aygestin  in stable condition with planned follow up as previously discussed.  Consults: hematology/oncology, vascular surgery, and hospitalist  Significant Diagnostic Studies:  Recent Results (from the past 2160 hours)  CBC     Status: Abnormal   Collection Time: 03/27/24  6:02 PM  Result Value Ref Range   WBC 9.3 4.0 - 10.5 K/uL   RBC 2.65 (L) 3.87 - 5.11 MIL/uL   Hemoglobin 6.0 (LL) 12.0 - 15.0 g/dL    Comment: REPEATED TO VERIFY This  critical result has been called to C CHRISCOE,RN 1837 03/27/2024 WBOND by Ryan Bracket on 03/27/2024 18:37:33, and has been read back.    HCT 21.5 (L) 36.0 - 46.0 %   MCV 81.1 80.0 - 100.0 fL   MCH 22.6 (L) 26.0 - 34.0 pg   MCHC 27.9 (L) 30.0 - 36.0 g/dL   RDW 82.4 (H) 88.4 - 84.4 %   Platelets 454 (H) 150 - 400 K/uL   nRBC 0.2 0.0 - 0.2 %    Comment: Performed at [facility] Lab, [address] [place], [place] [address]  hCG, serum, qualitative     Status: None   Collection Time: 03/27/24  6:02 PM  Result Value Ref Range   Preg, Serum NEGATIVE NEGATIVE    Comment:        THE SENSITIVITY OF THIS METHODOLOGY IS >10 mIU/mL. Performed at [facility] Lab, [address] [place], [place] [address]   Comprehensive metabolic panel     Status: Abnormal   Collection Time: 03/27/24  6:02 PM  Result Value Ref Range   Sodium 138 135 - 145 mmol/L   Potassium 3.6 3.5 - 5.1 mmol/L   Chloride 108 98 - 111 mmol/L   CO2 21 (L) 22 - 32 mmol/L   Glucose, Bld 112 (H) 70 - 99 mg/dL    Comment: Glucose reference range applies only to samples taken after fasting for at least 8 hours.   BUN 8 6 - 20 mg/dL   Creatinine, Ser 9.37 0.44 - 1.00 mg/dL   Calcium 9.0 8.9 - 89.6 mg/dL   Total Protein 7.0 6.5 -  8.1 g/dL   Albumin 3.4 (L) 3.5 - 5.0 g/dL   AST 14 (L) 15 - 41 U/L   ALT 20 0 - 44 U/L   Alkaline Phosphatase 60 38 - 126 U/L   Total Bilirubin 0.5 0.0 - 1.2 mg/dL   GFR, Estimated >39 >39 mL/min    Comment: (NOTE) Calculated using the CKD-EPI Creatinine Equation (2021)    Anion gap 9 5 - 15    Comment: Performed at [facility] Lab, [address], [place], [place] [address]  Troponin I (High Sensitivity)     Status: None   Collection Time: 03/27/24  6:02 PM  Result Value Ref Range   Troponin I (High Sensitivity) 3 <18 ng/L    Comment: (NOTE) Elevated high sensitivity troponin I (hsTnI) values and significant  changes across serial measurements may suggest ACS but many other  chronic  and acute conditions are known to elevate hsTnI results.  Refer to the Links section for chest pain algorithms and additional  guidance. Performed at [facility], [address], [place], [place] [address]   Type and screen [facility]     Status: None   Collection Time: 03/27/24  8:14 PM  Result Value Ref Range   ABO/RH(D) A POS    Antibody Screen NEG    Sample Expiration 03/30/2024,2359    Unit Number [id number]    Blood Component Type RED CELLS,LR    Unit division 00    Status of Unit ISSUED,FINAL    Transfusion Status OK TO TRANSFUSE    Crossmatch Result Compatible    Unit Number [id number]    Blood Component Type RBC LR PHER2    Unit division 00    Status of Unit ISSUED,FINAL    Transfusion Status OK TO TRANSFUSE    Crossmatch Result      Compatible Performed at [facility] Lab, [address], [place], [place] [address]   BPAM RBC     Status: None   Collection Time: 03/27/24  8:14 PM  Result Value Ref Range   ISSUE DATE / TIME 797491867847    Blood Product Unit Number [id number]    PRODUCT CODE [id number]    Unit Type and Rh 6200    Blood Product Expiration Date 797490867640    ISSUE DATE / TIME 797491859757    Blood Product Unit Number [id number]    PRODUCT CODE [id number]    Unit Type and Rh 6200    Blood Product Expiration Date 797490867640   Prepare RBC (crossmatch)     Status: None   Collection Time: 03/27/24  8:15 PM  Result Value Ref Range   Order Confirmation      ORDER PROCESSED BY BLOOD BANK Performed at [facility] Lab, [address], [place], [place] [address]   Troponin I (High Sensitivity)     Status: None   Collection Time: 03/27/24  8:24 PM  Result Value Ref Range   Troponin I (High Sensitivity) 3 <18 ng/L    Comment: (NOTE) Elevated high sensitivity troponin I (hsTnI) values and significant  changes across serial measurements may suggest ACS but many other  chronic and acute conditions are known to  elevate hsTnI results.  Refer to the Links section for chest pain algorithms and additional  guidance. Performed at [facility] Lab, [address], [place], [place] [address]   D-dimer, quantitative     Status: Abnormal   Collection Time: 03/27/24  9:43 PM  Result Value Ref Range   D-Dimer, Quant 2.95 (H) 0.00 - 0.50 ug/mL-FEU    Comment: (NOTE) At the manufacturer cut-off value of 0.5 g/mL FEU, this assay has a negative predictive value of 95-100%.This assay is intended for use in conjunction with a clinical pretest probability (PTP) assessment model to exclude pulmonary embolism (PE) and deep venous thrombosis (DVT) in outpatients suspected of PE or DVT. Results should be correlated with clinical presentation. Performed at [facility] Lab, [address] [place], [place] [address]   Vitamin B12     Status: None   Collection Time: 03/28/24  6:46 AM  Result Value Ref Range   Vitamin B-12 194 180 - 914 pg/mL    Comment: (NOTE) This assay is not validated for testing neonatal or myeloproliferative syndrome specimens for Vitamin B12 levels. Performed at [facility], [address], [place], [place] [address]   Folate     Status: None   Collection Time: 03/28/24  6:46 AM  Result Value Ref Range   Folate 12.9 >5.9 ng/mL    Comment: Performed at [facility] Lab, [address] [place], [place] [address]  Iron  and TIBC     Status: Abnormal   Collection Time: 03/28/24  6:46 AM  Result Value Ref Range   Iron  13 (L) 28 - 170 ug/dL   TIBC 611 749 - 549 ug/dL   Saturation Ratios 3 (L) 10.4 - 31.8 %   UIBC 375 ug/dL    Comment: Performed at [facility] Lab, [address], [place], [place] [address]  Ferritin     Status: Abnormal   Collection Time: 03/28/24  6:46 AM  Result Value Ref Range   Ferritin 4 (L) 11 - 307 ng/mL    Comment: Performed at [facility], [address] [place], [place] [address]  Reticulocytes     Status: Abnormal   Collection  Time: 03/28/24  6:46 AM  Result Value Ref Range   Retic Ct Pct 2.8 0.4 - 3.1 %    Comment: RESULTS CONFIRMED BY MANUAL DILUTION   RBC. 2.85 (L) 3.87 - 5.11 MIL/uL   Retic Count, Absolute 79.5 19.0 - 186.0 K/uL    Comment: RESULTS CONFIRMED BY MANUAL DILUTION   Immature Retic Fract 14.9 2.3 - 15.9 %    Comment: Performed at [facility] Lab, [address], [place], [place] [address]  CK     Status: None   Collection Time: 03/28/24  6:46 AM  Result Value Ref Range   Total CK 63 38 - 234 U/L    Comment: Performed at [facility] Lab, [address], [place], [place] [address]  Magnesium     Status: None   Collection Time: 03/28/24  6:46 AM  Result Value Ref Range   Magnesium 2.2 1.7 - 2.4 mg/dL    Comment: Performed at [facility], [address] [place], [place] [address]  Phosphorus     Status: None   Collection Time: 03/28/24  6:46 AM  Result Value Ref Range   Phosphorus 4.3 2.5 - 4.6 mg/dL    Comment: Performed at [facility], [address], [place], [place] [address]  Protime-INR     Status: None   Collection Time: 03/28/24  6:46 AM  Result Value Ref Range   Prothrombin Time 15.0 11.4 - 15.2 seconds   INR 1.1 0.8 - 1.2    Comment: (NOTE) INR goal varies based  on device and disease states. Performed at [facility] Lab, [address], [place], [place] [address]   TSH     Status: None   Collection Time: 03/28/24  6:46 AM  Result Value Ref Range   TSH 2.613 0.350 - 4.500 uIU/mL    Comment: Performed by a 3rd Generation assay with a functional sensitivity of <=0.01 uIU/mL. Performed at [facility], [address] [place], [place] [address]   HIV Antibody (routine testing w rflx)     Status: None   Collection Time: 03/28/24  6:46 AM  Result Value Ref Range   HIV Screen 4th Generation wRfx Non Reactive Non Reactive    Comment: Performed at [facility], [address], [place], [place] [address]  Magnesium     Status: None   Collection  Time: 03/28/24  6:46 AM  Result Value Ref Range   Magnesium 2.1 1.7 - 2.4 mg/dL    Comment: Performed at [facility], [address] [place], [place] [address]  Phosphorus     Status: None   Collection Time: 03/28/24  6:46 AM  Result Value Ref Range   Phosphorus 4.2 2.5 - 4.6 mg/dL    Comment: Performed at [facility] Lab, [address], [place], [place] [address]  Comprehensive metabolic panel     Status: Abnormal   Collection Time: 03/28/24  6:46 AM  Result Value Ref Range   Sodium 136 135 - 145 mmol/L   Potassium 3.7 3.5 - 5.1 mmol/L   Chloride 108 98 - 111 mmol/L   CO2 19 (L) 22 - 32 mmol/L   Glucose, Bld 89 70 - 99 mg/dL    Comment: Glucose reference range applies only to samples taken after fasting for at least 8 hours.   BUN 7 6 - 20 mg/dL   Creatinine, Ser 9.42 0.44 - 1.00 mg/dL   Calcium 8.2 (L) 8.9 - 10.3 mg/dL   Total Protein 6.4 (L) 6.5 - 8.1 g/dL   Albumin 3.0 (L) 3.5 - 5.0 g/dL   AST 12 (L) 15 - 41 U/L   ALT 17 0 - 44 U/L   Alkaline Phosphatase 56 38 - 126 U/L   Total Bilirubin 0.8 0.0 - 1.2 mg/dL   GFR, Estimated >39 >39 mL/min    Comment: (NOTE) Calculated using the CKD-EPI Creatinine Equation (2021)    Anion gap 9 5 - 15    Comment: Performed at [facility], [address], [place], [place] [address]  CBC     Status: Abnormal   Collection Time: 03/28/24  6:46 AM  Result Value Ref Range   WBC 6.7 4.0 - 10.5 K/uL   RBC 2.92 (L) 3.87 - 5.11 MIL/uL   Hemoglobin 7.0 (L) 12.0 - 15.0 g/dL   HCT 76.3 (L) 63.9 - 53.9 %   MCV 80.8 80.0 - 100.0 fL   MCH 24.0 (L) 26.0 - 34.0 pg   MCHC 29.7 (L) 30.0 - 36.0 g/dL   RDW 83.4 (H) 88.4 - 84.4 %   Platelets 375 150 - 400 K/uL   nRBC 0.0 0.0 - 0.2 %    Comment: Performed at [facility], [address] [place], [place] [address]  Basic metabolic panel     Status: Abnormal   Collection Time: 04/03/24  5:00 PM  Result Value Ref Range   Sodium 136 135 - 145 mmol/L   Potassium 3.7 3.5 - 5.1 mmol/L  Chloride 104 98 - 111 mmol/L   CO2 20 (L) 22 - 32 mmol/L   Glucose, Bld 164 (H) 70 - 99 mg/dL    Comment: Glucose reference range applies only to samples taken after fasting for at least 8 hours.   BUN 8 6 - 20 mg/dL   Creatinine, Ser 9.27 0.44 - 1.00 mg/dL   Calcium 8.5 (L) 8.9 - 10.3 mg/dL   GFR, Estimated >39 >39 mL/min    Comment: (NOTE) Calculated using the CKD-EPI Creatinine Equation (2021)    Anion gap 12 5 - 15    Comment: Performed at [facility], [address] [place], [place] [address]  Type and screen [facility]     Status: None   Collection Time: 04/03/24  5:50 PM  Result Value Ref Range   ABO/RH(D) A POS    Antibody Screen NEG    Sample Expiration 04/06/2024,2359    Unit Number [id number]    Blood Component Type RED CELLS,LR    Unit division 00    Status of Unit REL FROM [facility]    Transfusion Status OK TO TRANSFUSE    Crossmatch Result Compatible    Unit Number [id number]    Blood Component Type RED CELLS,LR    Unit division 00    Status of Unit REL FROM [facility]    Transfusion Status OK TO TRANSFUSE    Crossmatch Result Compatible    Unit Number [id number]    Blood Component Type RED CELLS,LR    Unit division 00    Status of Unit ISSUED,FINAL    Transfusion Status OK TO TRANSFUSE    Crossmatch Result Compatible    Unit Number [id number]    Blood Component Type RBC LR PHER1    Unit division 00    Status of Unit REL FROM [facility]    Transfusion Status OK TO TRANSFUSE    Crossmatch Result Compatible    Unit Number [id number]    Blood Component Type RED CELLS,LR    Unit division 00    Status of Unit ISSUED,FINAL    Transfusion Status OK TO TRANSFUSE    Crossmatch Result      Compatible Performed at [facility], [address] [place], [place] [address]    Unit Number [id number]    Blood Component Type RED CELLS,LR    Unit division 00    Status of Unit ISSUED,FINAL    Transfusion Status OK TO TRANSFUSE     Crossmatch Result Compatible   BPAM RBC     Status: None   Collection Time: 04/03/24  5:50 PM  Result Value Ref Range   ISSUE DATE / TIME 797491779588    Blood Product Unit Number [id number]    PRODUCT CODE [id number]    Unit Type and Rh 6200    Blood Product Expiration Date 797491707640    Blood Product Unit Number [id number]    PRODUCT CODE [id number]    Unit Type and Rh 6200    Blood Product Expiration Date 797490807640    ISSUE DATE / TIME 797491797778    Blood Product Unit Number [id number]    PRODUCT CODE [id number]    Unit Type and Rh 9500    Blood Product Expiration Date 797491777640    ISSUE DATE / TIME 797491789273    Blood Product Unit Number [id number]    PRODUCT CODE [id number]    Unit Type and Rh 6200    Blood Product Expiration Date 797490797640  ISSUE DATE / TIME 797491789668    Blood Product Unit Number [id number]    PRODUCT CODE [id number]    Unit Type and Rh 9500    Blood Product Expiration Date 797491747640    ISSUE DATE / TIME 797491788495    Blood Product Unit Number [id number]    PRODUCT CODE [id number]    Unit Type and Rh 6200    Blood Product Expiration Date 797490807640   hCG, serum, qualitative     Status: None   Collection Time: 04/03/24  9:25 PM  Result Value Ref Range   Preg, Serum NEGATIVE NEGATIVE    Comment:        THE SENSITIVITY OF THIS METHODOLOGY IS >10 mIU/mL. Performed at [facility], [address] [place], [place] [address]   CBC with Differential/Platelet     Status: Abnormal   Collection Time: 04/03/24  9:25 PM  Result Value Ref Range   WBC 10.7 (H) 4.0 - 10.5 K/uL   RBC 2.31 (L) 3.87 - 5.11 MIL/uL   Hemoglobin 5.5 (LL) 12.0 - 15.0 g/dL    Comment: REPEATED TO VERIFY This critical result has been called to J. Stommel, RN by Miguel Reveal on 04/03/2024 22:30:43, and has been read back.    HCT 19.2 (L) 36.0 - 46.0 %   MCV 83.1 80.0 - 100.0 fL   MCH 23.8 (L) 26.0 - 34.0 pg   MCHC 28.6 (L) 30.0 - 36.0  g/dL   RDW 82.3 (H) 88.4 - 84.4 %   Platelets 379 150 - 400 K/uL   nRBC 0.0 0.0 - 0.2 %   Neutrophils Relative % 69 %   Neutro Abs 7.5 1.7 - 7.7 K/uL   Lymphocytes Relative 23 %   Lymphs Abs 2.5 0.7 - 4.0 K/uL   Monocytes Relative 6 %   Monocytes Absolute 0.7 0.1 - 1.0 K/uL   Eosinophils Relative 1 %   Eosinophils Absolute 0.1 0.0 - 0.5 K/uL   Basophils Relative 1 %   Basophils Absolute 0.1 0.0 - 0.1 K/uL   Immature Granulocytes 0 %   Abs Immature Granulocytes 0.03 0.00 - 0.07 K/uL    Comment: Performed at [facility], [address], [place], [place] [address]  I-stat chem 8, ED (not at [facility], [facility] or [facility])     Status: Abnormal   Collection Time: 04/03/24  9:34 PM  Result Value Ref Range   Sodium 137 135 - 145 mmol/L   Potassium 3.5 3.5 - 5.1 mmol/L   Chloride 105 98 - 111 mmol/L   BUN 7 6 - 20 mg/dL   Creatinine, Ser 9.39 0.44 - 1.00 mg/dL   Glucose, Bld 896 (H) 70 - 99 mg/dL    Comment: Glucose reference range applies only to samples taken after fasting for at least 8 hours.   Calcium, Ion 1.13 (L) 1.15 - 1.40 mmol/L   TCO2 22 22 - 32 mmol/L   Hemoglobin 6.5 (LL) 12.0 - 15.0 g/dL   HCT 80.9 (L) 63.9 - 53.9 %   Comment NOTIFIED PHYSICIAN   Prepare RBC (crossmatch)     Status: None   Collection Time: 04/03/24  9:40 PM  Result Value Ref Range   Order Confirmation      ORDER PROCESSED BY BLOOD BANK Performed at [facility], [address], [place], [place] [address]   Prepare RBC (crossmatch)     Status: None   Collection Time: 04/04/24  3:09 AM  Result Value Ref  Range   Order Confirmation      ORDER PROCESSED BY BLOOD BANK Performed at [facility], [address], [place], [place] [address]   Pregnancy, urine     Status: None   Collection Time: 04/04/24  4:27 AM  Result Value Ref Range   Preg Test, Ur NEGATIVE NEGATIVE    Comment:        THE SENSITIVITY OF THIS METHODOLOGY IS >20 mIU/mL. Performed at [facility] Lab, [address],  [place], [place] [address]   CBC     Status: Abnormal   Collection Time: 04/04/24  9:41 AM  Result Value Ref Range   WBC 10.9 (H) 4.0 - 10.5 K/uL   RBC 2.57 (L) 3.87 - 5.11 MIL/uL   Hemoglobin 6.5 (LL) 12.0 - 15.0 g/dL    Comment: REPEATED TO VERIFY CRITICAL VALUE NOTED.  VALUE IS CONSISTENT WITH PREVIOUSLY REPORTED AND CALLED VALUE.    HCT 21.0 (L) 36.0 - 46.0 %   MCV 81.7 80.0 - 100.0 fL   MCH 25.3 (L) 26.0 - 34.0 pg   MCHC 31.0 30.0 - 36.0 g/dL   RDW 83.0 (H) 88.4 - 84.4 %   Platelets 340 150 - 400 K/uL   nRBC 0.3 (H) 0.0 - 0.2 %    Comment: Performed at [facility] Lab, [address] [place], [place] [address]  Comprehensive metabolic panel with GFR     Status: Abnormal   Collection Time: 04/04/24  9:41 AM  Result Value Ref Range   Sodium 138 135 - 145 mmol/L   Potassium 3.1 (L) 3.5 - 5.1 mmol/L   Chloride 106 98 - 111 mmol/L   CO2 22 22 - 32 mmol/L   Glucose, Bld 132 (H) 70 - 99 mg/dL    Comment: Glucose reference range applies only to samples taken after fasting for at least 8 hours.   BUN 6 6 - 20 mg/dL   Creatinine, Ser 9.30 0.44 - 1.00 mg/dL   Calcium 8.3 (L) 8.9 - 10.3 mg/dL   Total Protein 5.7 (L) 6.5 - 8.1 g/dL   Albumin 2.7 (L) 3.5 - 5.0 g/dL   AST 15 15 - 41 U/L   ALT 13 0 - 44 U/L   Alkaline Phosphatase 51 38 - 126 U/L   Total Bilirubin 1.1 0.0 - 1.2 mg/dL   GFR, Estimated >39 >39 mL/min    Comment: (NOTE) Calculated using the CKD-EPI Creatinine Equation (2021)    Anion gap 10 5 - 15    Comment: Performed at [facility] Lab, [address] [place], [place] [address]  Troponin I (High Sensitivity)     Status: None   Collection Time: 04/04/24  9:41 AM  Result Value Ref Range   Troponin I (High Sensitivity) 4 <18 ng/L    Comment: (NOTE) Elevated high sensitivity troponin I (hsTnI) values and significant  changes across serial measurements may suggest ACS but many other  chronic and acute conditions are known to elevate hsTnI results.  Refer to the  Links section for chest pain algorithms and additional  guidance. Performed at [facility] Lab, [address], [place], [place] [address]   Prepare RBC (crossmatch)     Status: None   Collection Time: 04/04/24  1:41 PM  Result Value Ref Range   Order Confirmation      ORDER PROCESSED BY BLOOD BANK Performed at [facility] Lab, [address] [place], [place] [address]   CBC  Status: Abnormal   Collection Time: 04/04/24  9:33 PM  Result Value Ref Range   WBC 12.3 (H) 4.0 - 10.5 K/uL   RBC 2.76 (L) 3.87 - 5.11 MIL/uL   Hemoglobin 7.3 (L) 12.0 - 15.0 g/dL   HCT 77.0 (L) 63.9 - 53.9 %   MCV 83.0 80.0 - 100.0 fL   MCH 26.4 26.0 - 34.0 pg   MCHC 31.9 30.0 - 36.0 g/dL   RDW 83.2 (H) 88.4 - 84.4 %   Platelets 340 150 - 400 K/uL   nRBC 0.3 (H) 0.0 - 0.2 %    Comment: Performed at [facility] Lab, [address] [place], [place] [address]  Basic metabolic panel with GFR     Status: Abnormal   Collection Time: 04/05/24  7:21 AM  Result Value Ref Range   Sodium 136 135 - 145 mmol/L   Potassium 3.6 3.5 - 5.1 mmol/L   Chloride 108 98 - 111 mmol/L   CO2 21 (L) 22 - 32 mmol/L   Glucose, Bld 140 (H) 70 - 99 mg/dL    Comment: Glucose reference range applies only to samples taken after fasting for at least 8 hours.   BUN 6 6 - 20 mg/dL   Creatinine, Ser 9.32 0.44 - 1.00 mg/dL   Calcium 8.2 (L) 8.9 - 10.3 mg/dL   GFR, Estimated >39 >39 mL/min    Comment: (NOTE) Calculated using the CKD-EPI Creatinine Equation (2021)    Anion gap 7 5 - 15    Comment: Performed at [facility] Lab, [address], [place], [place] [address]  CBC     Status: Abnormal   Collection Time: 04/05/24  7:21 AM  Result Value Ref Range   WBC 9.3 4.0 - 10.5 K/uL   RBC 2.76 (L) 3.87 - 5.11 MIL/uL   Hemoglobin 7.2 (L) 12.0 - 15.0 g/dL   HCT 76.4 (L) 63.9 - 53.9 %   MCV 85.1 80.0 - 100.0 fL   MCH 26.1 26.0 - 34.0 pg   MCHC 30.6 30.0 - 36.0 g/dL   RDW 83.1 (H) 88.4 - 84.4 %   Platelets 332 150 -  400 K/uL   nRBC 0.5 (H) 0.0 - 0.2 %    Comment: Performed at [facility], [address], [place], [place] [address]  Magnesium     Status: None   Collection Time: 04/05/24  7:21 AM  Result Value Ref Range   Magnesium 1.9 1.7 - 2.4 mg/dL    Comment: Performed at [facility] Lab, [address] [place], [place] [address]    Discharge Exam: Blood pressure (!) 112/52, pulse 69, temperature 98.2 F (36.8 C), temperature source Oral, resp. rate 20, height 5' 7 (1.702 m), weight (!) 158.8 kg, last menstrual period 12/19/2023, SpO2 99%.  Gen: Lying in bed, no acute distress Cv: reg rate Pulm: NWOB Abd: soft, nondistended Ext: no edema bilaterally. Nontender lower extremities.  Disposition: Discharge disposition: 01-Home or Self Care       Discharge Instructions     AMB Referral to Deep Vein Thrombosis Clinic   Complete by: As directed    Is patient currently on anticoagulation?: No   Call MD for:   Complete by: As directed    bleeding   Call MD for:  difficulty breathing, headache or visual disturbances   Complete by: As directed    Call MD for:  extreme fatigue   Complete by: As directed    Call  MD for:  persistant dizziness or light-headedness   Complete by: As directed    Call MD for:  severe uncontrolled pain   Complete by: As directed    Diet general   Complete by: As directed    Increase activity slowly   Complete by: As directed       Allergies as of 04/05/2024       Reactions   Chocolate Other (See Comments)   Inflammation (allergy testing completed)   Pork-derived Products    Inflammation         Medication List     STOP taking these medications    Eliquis  DVT/PE Starter Pack Generic drug: Apixaban  Starter Pack (10mg  and 5mg )       TAKE these medications    acetaminophen  500 MG tablet Commonly known as: TYLENOL  Take 1 tablet (500 mg total) by mouth every 6 (six) hours as needed. What changed:  how much to take reasons to take  this   Airborne Elderberry Chew Chew 1 capsule by mouth every other day.   ALPRAZolam  0.25 MG tablet Commonly known as: XANAX  Take 0.25 mg by mouth 2 (two) times daily as needed for anxiety.   aspirin  EC 325 MG tablet Take 1 tablet (325 mg total) by mouth daily.   busPIRone  7.5 MG tablet Commonly known as: BUSPAR  Take 1 tablet (7.5 mg total) by mouth 2 (two) times daily. For anxiety What changed:  how much to take when to take this additional instructions   cyanocobalamin  1000 MCG tablet Take 1 tablet (1,000 mcg total) by mouth daily. Start taking on: April 07, 2024   ferrous sulfate  325 (65 FE) MG EC tablet Take 1 tablet (325 mg total) by mouth every other day. What changed: when to take this   folic acid  1 MG tablet Commonly known as: FOLVITE  Take 1 tablet (1 mg total) by mouth daily.   norethindrone  5 MG tablet Commonly known as: AYGESTIN  Take 2 tablets (10 mg total) by mouth daily. What changed: how much to take   prenatal multivitamin Tabs tablet Take 1 tablet by mouth daily at 12 noon.   Vitamin D3 125 MCG (5000 UT) Tabs Take 5,000 Units by mouth at bedtime.        Follow-up Information     [facility] Follow up.   Contact information: [address] [place] [address] [phone]         [facility] Follow up.   Specialty: Vascular Surgery Why: weekly follow ups.The office will call you with your appointment Contact information: [address] [place]  [id number] [phone]                Signed: Slater JINNY Newton 04/05/2024, 8:14 AM

## 2024-04-05 NOTE — Discharge Instructions (Signed)
Discharge Instructions for DVT (Deep Vein Thrombosis):  Call 911 or visit the nearest emergency room if you experience any of the following:  Sudden chest tightness or pain. Sharp pain when taking a deep breath. Cough with bloody mucus. Sudden shortness of breath or fast breathing A fast heart rate. Cold skin, clammy skin, or sweating. New swelling in the arms or legs.  Continue to take your anticoagulation medication daily as prescribed Elevate your affected extremity daily above the level of the heart Wear your thigh high compression stocking daily You will have follow up with your Vascular provider in 1 month with an ultrasound

## 2024-04-09 ENCOUNTER — Other Ambulatory Visit (HOSPITAL_COMMUNITY): Payer: Self-pay

## 2024-04-09 ENCOUNTER — Telehealth: Payer: Self-pay

## 2024-04-09 NOTE — Progress Notes (Unsigned)
 DVT Clinic Note  Name: Alexandra Newton     MRN: [id number]     DOB: [birth year]     Sex: female  PCP: Alexandra Almarie SAUNDERS, MD  Today's Visit: Visit Information: Initial Visit  Referred to DVT Clinic by: [facility] - Alexandra Blanch, MD Referred to CPP by: Dr. Magda Reason for referral: No chief complaint on file.  HISTORY OF PRESENT ILLNESS: Alexandra Newton is a 33 y.o. female with PMH obesity, IDA, menorrhagia, depression who presents after diagnosis of DVT for medication management. Patient was initially admitted 03/27/24 to 03/28/24 for anemia in the setting of heavy vaginal bleeding and DVT. Heavy bleeding began after D&C in May for endometrial hyperplasia. Ultrasound showed acute DVT involving the left peroneal veins and intramuscular thrombosis in the left gastrocnemius veins. While admitted, she received Megace  and 2 units PRBC. She had also recently been started on norethindrone  outpatient for HMB. She was discharged on Eliquis  and norethindrone  was discontinued. She was readmitted 04/03/24 to 04/05/24 for worsening bleeding since starting on Eliquis , Hgb down to 5.5. Eliquis  was held and she was restarted on norethindrone . Planned to follow up outpatient at [facility] for definitive management with hysterectomy. Vascular surgery was consulted for consideration of IVC filter placement. Repeat ultrasound showed some resolution of DVT, thrombus only present in 1 of her paired peroneal veins. Per Dr. Magda, typically would not recommend placement of IVC filter in setting of low risk peroneal vein DVT. However, given she is at higher risk with pending future surgery and current use of progesterone, IVC filter placement was discussed. Patient preferred the most conservative therapy and she was started on aspirin  325 mg daily with plan for weekly DVT studies until she is able to undergo hysterectomy or reliably start on anticoagulation. Referred to DVT clinic for follow up.   Today patient presents to clinic  ambulating independently. She had an appointment earlier today with [facility] Alexandra Newton to discuss hysterectomy. She states that she was hoping to get a hysterectomy scheduled with them. Patient reports they expressed concerns over hysterectomy due to her weight and recent DVT. Also states that they are scheduling out to December/January currently for procedures. They discussed other options including retrying IUD insertion or increasing her norethindrone  from twice daily to three times daily, but wanted her to confirm with vascular if we are comfortable with this first. They also discussed starting on leuprolide which the patient says she is not interested in. Currently she says her vaginal bleeding is not great, but more manageable since restarting norethindrone . She says she changes her pad about every 4 hours, but sometimes more frequently than that. Does not currently need to use tampons. Reports feeling okay, not as weak as she was previously and mild SOB only with activity. Denies any pain or swelling in her left leg.  Positive Thrombotic Risk Factors: Obesity, Other (comment) (progesterone therapy) Bleeding Risk Factors: Anemia, Previous bleeding  Negative Thrombotic Risk Factors: Recent surgery (within 3 months), Recent trauma (within 3 months), Paralysis, paresis, or recent plaster cast immobilization of lower extremity, Central venous catheterization, Sedentary journey lasting >8 hours within 4 weeks, Pregnancy, Within 6 weeks postpartum, Estrogen therapy, Testosterone therapy, Recent cesarean section (within 3 months), Erythropoiesis-stimulating agent, Recent COVID diagnosis (within 3 months), Known thrombophilic condition, Non-malignant, chronic inflammatory condition, Active cancer, Older age, Bed rest >72 hours within 3 months, Recent admission to hospital with acute illness (within 3 months), Smoking  Rx Insurance Coverage: Commercial Rx Affordability: Eliquis  is $0 for a  90 day supply Rx Assistance  Provided: N/A  Past Medical History:  Diagnosis Date   Allergy    seasonal   Anxiety    Anxiety    Borderline anemia    pt had 2 iron  infusions in March/April 2025   Depression    Depression    Phreesia 10/11/2020   History of bronchitis as a child    Panic attacks     Past Surgical History:  Procedure Laterality Date   HYSTEROSCOPY WITH D & C N/A 12/19/2023   Procedure: DILATATION AND CURETTAGE /HYSTEROSCOPY;  Surgeon: Alexandra Slater PARAS, MD;  Location: [facility] OR;  Service: Gynecology;  Laterality: N/A;  POSSIBLE MYOSURE LITE   INTRAUTERINE DEVICE (IUD) INSERTION N/A 12/19/2023   Procedure: INSERTION, INTRAUTERINE DEVICE;  Surgeon: Alexandra Slater PARAS, MD;  Location: [facility] OR;  Service: Gynecology;  Laterality: N/A;  MIRENA    LAPAROSCOPIC GASTRIC SLEEVE RESECTION N/A 02/22/2016   Procedure: LAPAROSCOPIC GASTRIC SLEEVE RESECTION WITH UPPER ENDO;  Surgeon: Alexandra Olives, MD;  Location: [facility] ORS;  Service: General;  Laterality: N/A;   MYOSURE RESECTION  12/19/2023   Procedure: MELINDA RESECTION;  Surgeon: Alexandra Slater PARAS, MD;  Location: [facility] OR;  Service: Gynecology;;   WISDOM TOOTH EXTRACTION     4    Social History   Socioeconomic History   Marital status: Single    Spouse name: Not on file   Number of children: Not on file   Years of education: Not on file   Highest education level: Not on file  Occupational History   Not on file  Tobacco Use   Smoking status: Never   Smokeless tobacco: Never  Vaping Use   Vaping status: Never Used  Substance and Sexual Activity   Alcohol use: Yes    Comment: socially, maybe once or twice a month   Drug use: No   Sexual activity: Never  Other Topics Concern   Not on file  Social History Narrative   Not on file   Social Drivers of Health   Financial Resource Strain: Not on file  Food Insecurity: No Food Insecurity (04/04/2024)   Hunger Vital Sign    Worried About Running Out of Food in the Last Year: Never true    Ran Out of Food in the Last Year: Never  true  Transportation Needs: No Transportation Needs (04/04/2024)   PRAPARE - Transportation    Lack of Transportation (Medical): No    Lack of Transportation (Non-Medical): No  Physical Activity: Not on file  Stress: Not on file  Social Connections: Not on file  Intimate Partner Violence: Not At Risk (04/04/2024)   Humiliation, Afraid, Rape, and Kick questionnaire    Fear of Current or Ex-Partner: No    Emotionally Abused: No    Physically Abused: No    Sexually Abused: No    Family History  Problem Relation Age of Onset   Arthritis Mother    Cancer Mother        ovarian   Depression Mother    Diabetes Mother    Hypertension Mother    Obesity Mother    Alcohol abuse Father    Arthritis Father    Depression Father    Arthritis Sister    Depression Sister    Early death Maternal Grandmother    Obesity Maternal Grandmother    Cancer Maternal Grandmother        Lung    Cancer Maternal Grandfather        prostate  Diabetes Maternal Grandfather    Hypertension Maternal Grandfather    Arthritis Paternal Grandmother    Depression Paternal Grandmother    Diabetes Paternal Grandmother    Hypertension Paternal Grandmother    Hyperlipidemia Paternal Grandfather    Stroke Paternal Grandfather    Cancer Maternal Aunt        breast   Depression Maternal Aunt     Allergies as of 04/11/2024 - Review Complete 04/11/2024  Allergen Reaction Noted   Chocolate Other (See Comments) 12/12/2023   Pork-derived products  12/12/2023    Current Outpatient Medications on File Prior to Visit  Medication Sig Dispense Refill   acetaminophen  (TYLENOL ) 500 MG tablet Take 1 tablet (500 mg total) by mouth every 6 (six) hours as needed. 30 tablet 0   ALPRAZolam  (XANAX ) 0.25 MG tablet Take 0.25 mg by mouth 2 (two) times daily as needed for anxiety.     aspirin  EC 325 MG tablet Take 1 tablet (325 mg total) by mouth daily. 30 tablet 1   busPIRone  (BUSPAR ) 7.5 MG tablet Take 1 tablet (7.5 mg total) by  mouth 2 (two) times daily. For anxiety 60 tablet 3   Cholecalciferol (VITAMIN D3) 125 MCG (5000 UT) TABS Take 5,000 Units by mouth at bedtime.     cyanocobalamin  1000 MCG tablet Take 1 tablet (1,000 mcg total) by mouth daily. 30 tablet 0   ferrous sulfate  325 (65 FE) MG EC tablet Take 1 tablet (325 mg total) by mouth every other day. 45 tablet 3   folic acid  (FOLVITE ) 1 MG tablet Take 1 tablet (1 mg total) by mouth daily. 30 tablet 1   Multiple Vitamins-Minerals (AIRBORNE ELDERBERRY) CHEW Chew 1 capsule by mouth every other day.     norethindrone  (AYGESTIN ) 5 MG tablet Take 2 tablets (10 mg total) by mouth daily. 30 tablet 1   Prenatal Vit-Fe Fumarate-FA (PRENATAL MULTIVITAMIN) TABS tablet Take 1 tablet by mouth daily at 12 noon. 30 tablet 1   No current facility-administered medications on file prior to visit.   REVIEW OF SYSTEMS:  Review of Systems  Respiratory:  Negative for shortness of breath.   Cardiovascular:  Negative for chest pain and leg swelling.  Neurological:  Negative for dizziness and tingling.   PHYSICAL EXAMINATION:  Vitals:   04/11/24 1635  BP: 112/69  Pulse: 82  SpO2: 98%  Weight: (!) 350 lb 11.2 oz (159.1 kg)    Body mass index is 54.93 kg/m.  Physical Exam Pulmonary:     Effort: Pulmonary effort is normal.  Musculoskeletal:        General: No tenderness.     Left lower leg: No edema.  Neurological:     Mental Status: She is alert.    Villalta Score for Post-Thrombotic Syndrome: Pain: Absent Cramps: Absent Heaviness: Absent Paresthesia: Absent Pruritus: Absent Pretibial Edema: Absent Skin Induration: Absent Hyperpigmentation: Absent Redness: Absent Venous Ectasia: Absent Pain on calf compression: Absent Villalta Preliminary Score: 0 Is venous ulcer present?: No If venous ulcer is present and score is <15, then 15 points total are assigned: Absent Villalta Total Score: 0  LABS:  CBC     Component Value Date/Time   WBC 9.3 04/05/2024 0721    RBC 2.76 (L) 04/05/2024 0721   HGB 7.2 (L) 04/05/2024 0721   HCT 23.5 (L) 04/05/2024 0721   PLT 332 04/05/2024 0721   MCV 85.1 04/05/2024 0721   MCH 26.1 04/05/2024 0721   MCHC 30.6 04/05/2024 0721   RDW 16.8 (H) 04/05/2024  0721   LYMPHSABS 2.5 04/03/2024 2125   MONOABS 0.7 04/03/2024 2125   EOSABS 0.1 04/03/2024 2125   BASOSABS 0.1 04/03/2024 2125    Hepatic Function      Component Value Date/Time   PROT 5.7 (L) 04/04/2024 0941   ALBUMIN 2.7 (L) 04/04/2024 0941   AST 15 04/04/2024 0941   ALT 13 04/04/2024 0941   ALKPHOS 51 04/04/2024 0941   BILITOT 1.1 04/04/2024 0941    Renal Function   Lab Results  Component Value Date   CREATININE 0.67 04/05/2024   CREATININE 0.69 04/04/2024   CREATININE 0.60 04/03/2024    Estimated Creatinine Clearance: 158.8 mL/min (by C-G formula based on SCr of 0.67 mg/dL).   [facility] Vascular Lab Studies:  04/11/24 VAS US  LOWER EXTREMITY VENOUS LEFT (DVT): Summary:  RIGHT:  - No evidence of common femoral vein obstruction.    LEFT:  - Findings consistent with acute deep vein thrombosis involving the left peroneal veins. No significant change when compared to prior study.  - No cystic structure found in the popliteal fossa.   04/04/24 VAS US  LOWER EXTREMITY VENOUS LEFT (DVT): Summary:  LEFT:  - Findings consistent with acute deep vein thrombosis involving the left peroneal veins.  - Persistent acute appearing deep vein thrombosis in 1 of 2 peroneal veins.   03/28/24 VAS US  LOWER EXTREMITY VENOUS LEFT (DVT): LEFT:  - Findings consistent with acute deep vein thrombosis involving the left peroneal veins.  Findings consistent with acute intramuscular thrombosis involving the left gastrocnemius veins.   ASSESSMENT: Location of DVT: Left distal vein Cause of DVT: provoked by a transient risk factor  Patient without history of DVT diagnosed with acute DVT in the left peroneal veins. Due to severe uterine bleeding resulting in anemia requiring  hospitalization and transfusion in the setting of starting Eliquis  for DVT treatment, she is currently not on anticoagulation. While hospitalized, vascular surgery was consulted for possible IVC filter placement. Dr. Magda discussed this with her, but patient preferred to avoid an IVC filter if possible. Dr. Magda recommended weekly ultrasounds to assess for clot propagation and aspirin  325 mg daily until she is able to undergo hysterectomy and reliably start on anticoagulation. She is at continued risk for clotting given potential upcoming surgery and current progesterone use. Patient had an appointment with [facility] Alexandra Newton this morning and they discussed several options with her including increasing her progesterone dose, retrialing IUD insertion, leuprolide, and hysterectomy. Per patient report, they said hysterectomy would not be preferred due increased risk of complications due to her weight and DVT. Patient does not want to start on leuprolide and does not prefer to retry IUD insertion. Discussed with Dr. Magda and would not recommend increasing the dose of progesterone given her DVT, not currently on anticoagulation. Discussed this with her and she expressed understanding. Repeat ultrasound today is unchanged from prior, no evidence of propagation. Dr. Magda recommended weekly ultrasounds for the next 2 weeks and continuing aspirin  325 mg daily. Per Dr. Magda, if DVT is not resolved after this, will likely need an IVC filter. Plan for her to follow up next week to discuss her plan of care further with Dr. Magda and repeat ultrasound. She was previously referred to hematology, but was not able to attend the appointment due to being hospitalized so will re-refer her today.  Follow up: Next Tuesday for repeat ultrasound and office visit with Dr. Magda Izetta Henry, PharmD Deep Vein Thrombosis Clinic Clinical Pharmacist

## 2024-04-09 NOTE — Telephone Encounter (Signed)
 Patient Advocate Encounter  Test billing for this patients current coverage (RX Advance) returns a $0 copay for 90 day supply of Eliquis .  This test claim was processed through [facility]- copay amounts may vary at other pharmacies due to pharmacy/plan contracts, or as the patient moves through the different stages of their insurance plan.  Rachel DEL, CPhT Rx Patient Advocate Phone: [phone]

## 2024-04-11 ENCOUNTER — Ambulatory Visit (HOSPITAL_COMMUNITY)
Admission: RE | Admit: 2024-04-11 | Discharge: 2024-04-11 | Disposition: A | Discharge status: Home / Self Care | Source: Ambulatory Visit | Attending: Surgery | Admitting: Surgery

## 2024-04-11 ENCOUNTER — Ambulatory Visit: Attending: Surgery | Admitting: Pharmacist

## 2024-04-11 VITALS — BP 112/69 | HR 82 | Wt 350.7 lb

## 2024-04-11 DIAGNOSIS — I824Z2 Acute embolism and thrombosis of unspecified deep veins of left distal lower extremity: Secondary | ICD-10-CM | POA: Diagnosis not present

## 2024-04-11 DIAGNOSIS — I82452 Acute embolism and thrombosis of left peroneal vein: Secondary | ICD-10-CM

## 2024-04-11 NOTE — Patient Instructions (Addendum)
-   Continue aspirin  325 mg daily  If you have any questions or need to reschedule an appointment, please call [phone]. If you are having an emergency, call 911 or present to the nearest emergency room.   What is a DVT?  -Deep vein thrombosis (DVT) is a condition in which a blood clot forms in a vein of the deep venous system which can occur in the lower leg, thigh, pelvis, arm, or neck. This condition is serious and can be life-threatening if the clot travels to the arteries of the lungs and causing a blockage (pulmonary embolism, PE). A DVT can also damage veins in the leg, which can lead to long-term venous disease, leg pain, swelling, discoloration, and ulcers or sores (post-thrombotic syndrome).  -Treatment may include taking an anticoagulant medication to prevent more clots from forming and the current clot from growing, wearing compression stockings, and/or surgical procedures to remove or dissolve the clot.

## 2024-04-12 ENCOUNTER — Other Ambulatory Visit: Payer: Self-pay | Admitting: Pharmacist

## 2024-04-12 DIAGNOSIS — I82452 Acute embolism and thrombosis of left peroneal vein: Secondary | ICD-10-CM

## 2024-04-15 NOTE — Progress Notes (Unsigned)
 [facility] [place]  ASSESSMENT / PLAN: 33 y.o. female with calf vein DVT, heavy menstrual bleeding with profound anemia on anticoagulation therapy. IVC filter was offered, but declined to manage her DVT risk. She has yet to find a gynecologist that can do a hysterectomy for her.  Continue aspirin  325 mg by mouth daily.  Continue weekly duplex until patient can be definitively managed with anticoagulation or IVC filter.  SUBJECTIVE: Doing okay.  Continues to have some menstrual bleeding, but not as heavy while on coagulation.  Has been declined for hysterectomy by [facility] group.  Has been suggested to undergo Mirena  and increased her progesterone dose.  I agree with the patient, progesterone is likely to exacerbate her deep venous thrombosis.  OBJECTIVE: BP 121/71 (BP Location: Left Arm, Patient Position: Sitting, Cuff Size: Large)   Pulse 66   Temp 98.4 F (36.9 C)   Ht 5' 7 (1.702 m)   Wt (!) 350 lb (158.8 kg)   LMP 12/19/2023 (Exact Date) Comment: has not had period since GYN procedure  SpO2 98%   BMI 54.82 kg/m    Obese young woman in no distress Regular rate and rhythm Unlabored breathing No edema of the lower extremities     Latest Ref Rng & Units 04/05/2024    7:21 AM 04/04/2024    9:33 PM 04/04/2024    9:41 AM  CBC  WBC 4.0 - 10.5 K/uL 9.3  12.3  10.9   Hemoglobin 12.0 - 15.0 g/dL 7.2  7.3  6.5   Hematocrit 36.0 - 46.0 % 23.5  22.9  21.0   Platelets 150 - 400 K/uL 332  340  340         Latest Ref Rng & Units 04/05/2024    7:21 AM 04/04/2024    9:41 AM 04/03/2024    9:34 PM  CMP  Glucose 70 - 99 mg/dL 859  867  896   BUN 6 - 20 mg/dL 6  6  7    Creatinine 0.44 - 1.00 mg/dL 9.32  9.30  9.39   Sodium 135 - 145 mmol/L 136  138  137   Potassium 3.5 - 5.1 mmol/L 3.6  3.1  3.5   Chloride 98 - 111 mmol/L 108  106  105   CO2 22 - 32 mmol/L 21  22    Calcium 8.9 - 10.3 mg/dL 8.2  8.3    Total Protein 6.5 - 8.1 g/dL  5.7    Total Bilirubin 0.0 - 1.2  mg/dL  1.1    Alkaline Phos 38 - 126 U/L  51    AST 15 - 41 U/L  15    ALT 0 - 44 U/L  13      Estimated Creatinine Clearance: 158.7 mL/min (by C-G formula based on SCr of 0.67 mg/dL).  Lower extremity venous duplex  RIGHT:  - No evidence of common femoral vein obstruction.         LEFT:  - Findings consistent with continued acute deep vein thrombosis involving  one of the paired peroneal veins. No significant change when compared to  the previous exams.   Debby SAILOR. Magda, MD [facility] Phone Number: [phone] 04/16/2024 9:12 PM

## 2024-04-16 ENCOUNTER — Ambulatory Visit: Attending: Vascular Surgery | Admitting: Vascular Surgery

## 2024-04-16 ENCOUNTER — Ambulatory Visit (HOSPITAL_COMMUNITY)
Admission: RE | Admit: 2024-04-16 | Discharge: 2024-04-16 | Disposition: A | Discharge status: Home / Self Care | Source: Ambulatory Visit | Attending: Vascular Surgery | Admitting: Vascular Surgery

## 2024-04-16 ENCOUNTER — Encounter: Payer: Self-pay | Admitting: Vascular Surgery

## 2024-04-16 VITALS — BP 121/71 | HR 66 | Temp 98.4°F | Ht 67.0 in | Wt 350.0 lb

## 2024-04-16 DIAGNOSIS — I82452 Acute embolism and thrombosis of left peroneal vein: Secondary | ICD-10-CM

## 2024-04-17 ENCOUNTER — Other Ambulatory Visit: Payer: Self-pay

## 2024-04-17 DIAGNOSIS — I82452 Acute embolism and thrombosis of left peroneal vein: Secondary | ICD-10-CM

## 2024-04-19 ENCOUNTER — Inpatient Hospital Stay

## 2024-04-19 ENCOUNTER — Inpatient Hospital Stay: Attending: Hematology | Admitting: Hematology

## 2024-04-19 ENCOUNTER — Encounter: Payer: Self-pay | Admitting: Hematology

## 2024-04-19 VITALS — BP 152/84 | HR 79 | Temp 97.8°F | Resp 18 | Wt 349.4 lb

## 2024-04-19 DIAGNOSIS — Z801 Family history of malignant neoplasm of trachea, bronchus and lung: Secondary | ICD-10-CM | POA: Diagnosis not present

## 2024-04-19 DIAGNOSIS — Z8261 Family history of arthritis: Secondary | ICD-10-CM | POA: Insufficient documentation

## 2024-04-19 DIAGNOSIS — Z83438 Family history of other disorder of lipoprotein metabolism and other lipidemia: Secondary | ICD-10-CM | POA: Insufficient documentation

## 2024-04-19 DIAGNOSIS — Z8249 Family history of ischemic heart disease and other diseases of the circulatory system: Secondary | ICD-10-CM | POA: Insufficient documentation

## 2024-04-19 DIAGNOSIS — E538 Deficiency of other specified B group vitamins: Secondary | ICD-10-CM | POA: Diagnosis not present

## 2024-04-19 DIAGNOSIS — Z7982 Long term (current) use of aspirin: Secondary | ICD-10-CM | POA: Insufficient documentation

## 2024-04-19 DIAGNOSIS — Q278 Other specified congenital malformations of peripheral vascular system: Secondary | ICD-10-CM | POA: Insufficient documentation

## 2024-04-19 DIAGNOSIS — I82462 Acute embolism and thrombosis of left calf muscular vein: Secondary | ICD-10-CM | POA: Insufficient documentation

## 2024-04-19 DIAGNOSIS — Z803 Family history of malignant neoplasm of breast: Secondary | ICD-10-CM | POA: Diagnosis not present

## 2024-04-19 DIAGNOSIS — Z823 Family history of stroke: Secondary | ICD-10-CM | POA: Diagnosis not present

## 2024-04-19 DIAGNOSIS — Z86718 Personal history of other venous thrombosis and embolism: Secondary | ICD-10-CM | POA: Insufficient documentation

## 2024-04-19 DIAGNOSIS — Z8349 Family history of other endocrine, nutritional and metabolic diseases: Secondary | ICD-10-CM | POA: Insufficient documentation

## 2024-04-19 DIAGNOSIS — Z79899 Other long term (current) drug therapy: Secondary | ICD-10-CM | POA: Diagnosis not present

## 2024-04-19 DIAGNOSIS — Z8041 Family history of malignant neoplasm of ovary: Secondary | ICD-10-CM | POA: Diagnosis not present

## 2024-04-19 DIAGNOSIS — Z833 Family history of diabetes mellitus: Secondary | ICD-10-CM | POA: Diagnosis not present

## 2024-04-19 DIAGNOSIS — D500 Iron deficiency anemia secondary to blood loss (chronic): Secondary | ICD-10-CM | POA: Insufficient documentation

## 2024-04-19 DIAGNOSIS — Z793 Long term (current) use of hormonal contraceptives: Secondary | ICD-10-CM | POA: Diagnosis not present

## 2024-04-19 DIAGNOSIS — Z818 Family history of other mental and behavioral disorders: Secondary | ICD-10-CM | POA: Diagnosis not present

## 2024-04-19 DIAGNOSIS — M47814 Spondylosis without myelopathy or radiculopathy, thoracic region: Secondary | ICD-10-CM | POA: Diagnosis not present

## 2024-04-19 DIAGNOSIS — D509 Iron deficiency anemia, unspecified: Secondary | ICD-10-CM | POA: Diagnosis not present

## 2024-04-19 DIAGNOSIS — Z7901 Long term (current) use of anticoagulants: Secondary | ICD-10-CM | POA: Insufficient documentation

## 2024-04-19 DIAGNOSIS — N920 Excessive and frequent menstruation with regular cycle: Secondary | ICD-10-CM | POA: Insufficient documentation

## 2024-04-19 DIAGNOSIS — Z811 Family history of alcohol abuse and dependence: Secondary | ICD-10-CM | POA: Insufficient documentation

## 2024-04-19 DIAGNOSIS — K1379 Other lesions of oral mucosa: Secondary | ICD-10-CM | POA: Insufficient documentation

## 2024-04-19 LAB — CBC WITH DIFFERENTIAL/PLATELET
Abs Immature Granulocytes: 0.01 K/uL (ref 0.00–0.07)
Basophils Absolute: 0 K/uL (ref 0.0–0.1)
Basophils Relative: 1 %
Eosinophils Absolute: 0 K/uL (ref 0.0–0.5)
Eosinophils Relative: 1 %
HCT: 29.6 % — ABNORMAL LOW (ref 36.0–46.0)
Hemoglobin: 8.5 g/dL — ABNORMAL LOW (ref 12.0–15.0)
Immature Granulocytes: 0 %
Lymphocytes Relative: 29 %
Lymphs Abs: 1.8 K/uL (ref 0.7–4.0)
MCH: 26 pg (ref 26.0–34.0)
MCHC: 28.7 g/dL — ABNORMAL LOW (ref 30.0–36.0)
MCV: 90.5 fL (ref 80.0–100.0)
Monocytes Absolute: 0.4 K/uL (ref 0.1–1.0)
Monocytes Relative: 6 %
Neutro Abs: 3.8 K/uL (ref 1.7–7.7)
Neutrophils Relative %: 63 %
Platelets: 503 K/uL — ABNORMAL HIGH (ref 150–400)
RBC: 3.27 MIL/uL — ABNORMAL LOW (ref 3.87–5.11)
RDW: 18.4 % — ABNORMAL HIGH (ref 11.5–15.5)
WBC: 6 K/uL (ref 4.0–10.5)
nRBC: 0 % (ref 0.0–0.2)

## 2024-04-19 LAB — FERRITIN: Ferritin: 23 ng/mL (ref 11–307)

## 2024-04-19 LAB — RETIC PANEL
Immature Retic Fract: 22.6 % — ABNORMAL HIGH (ref 2.3–15.9)
RBC.: 3.23 MIL/uL — ABNORMAL LOW (ref 3.87–5.11)
Retic Count, Absolute: 136 K/uL (ref 19.0–186.0)
Retic Ct Pct: 4.2 % — ABNORMAL HIGH (ref 0.4–3.1)
Reticulocyte Hemoglobin: 22.7 pg — ABNORMAL LOW (ref >27.9)

## 2024-04-19 LAB — SAMPLE TO BLOOD BANK

## 2024-04-19 LAB — VITAMIN B12: Vitamin B-12: 504 pg/mL (ref 180–914)

## 2024-04-19 NOTE — Progress Notes (Signed)
 [facility]   [phone]) [phone] [phone]   Clinic New Consult Note   Patient Care Team: Waylan Almarie SAUNDERS, MD as PCP - General (Family Medicine) 04/19/2024  CHIEF COMPLAINTS/PURPOSE OF CONSULTATION:  DVT of LLE and anemia   REFERRING PHYSICIAN: Magda Debby SAILOR, MD   Discussed the use of AI scribe software for clinical note transcription with the patient, who gave verbal consent to proceed.  History of Present Illness Denia Mcvicar Hartsell is a 33 year old female who presents for a new consult for DVT and anemia. She was referred by her doctor for evaluation of blood clots and anemia.  She developed a DVT in her left leg in August 2025 while on high-dose progesterone. An ultrasound confirmed the diagnosis. She initially experienced cramp-like pain in her leg, which has mostly resolved but occasionally causes discomfort when flexing her foot. She was started on Eliquis  but discontinued it due to exacerbated bleeding and is currently taking aspirin  325 mg daily.  Her anemia worsened after a D&C, IUD placement, and hysteroscopy in May 2025, leading to continuous bleeding until August 2025. Her hemoglobin dropped to 6.0 g/dL, requiring five blood transfusions. She has been on oral iron  supplements and received three IV iron  infusions, the last in August 2025. Despite this, she continues to experience low iron  levels, weakness, and difficulty breathing.  Her past medical history includes gastric sleeve surgery in 2017, after which she lost 100 pounds but regained about 40 pounds. She has been taking iron  supplements since the surgery. Her B12 levels were low in August 2025.  Family history includes ovarian cysts in her mother, lung cancer in her maternal grandmother, and prostate cancer in her paternal grandfather. She underwent genetic testing related to her aunt's breast cancer but is unsure of the results.     MEDICAL HISTORY:  Past Medical History:  Diagnosis Date    Allergy    seasonal   Anxiety    Anxiety    Borderline anemia    pt had 2 iron  infusions in March/April 2025   Depression    Depression    Phreesia 10/11/2020   History of bronchitis as a child    Panic attacks     SURGICAL HISTORY: Past Surgical History:  Procedure Laterality Date   HYSTEROSCOPY WITH D & C N/A 12/19/2023   Procedure: DILATATION AND CURETTAGE /HYSTEROSCOPY;  Surgeon: Laurence Slater PARAS, MD;  Location: [facility] OR;  Service: Gynecology;  Laterality: N/A;  POSSIBLE MYOSURE LITE   INTRAUTERINE DEVICE (IUD) INSERTION N/A 12/19/2023   Procedure: INSERTION, INTRAUTERINE DEVICE;  Surgeon: Laurence Slater PARAS, MD;  Location: [facility] OR;  Service: Gynecology;  Laterality: N/A;  MIRENA    LAPAROSCOPIC GASTRIC SLEEVE RESECTION N/A 02/22/2016   Procedure: LAPAROSCOPIC GASTRIC SLEEVE RESECTION WITH UPPER ENDO;  Surgeon: Morene Olives, MD;  Location: [facility] ORS;  Service: General;  Laterality: N/AMERL NAIL RESECTION  12/19/2023   Procedure: NAIL RESECTION;  Surgeon: Laurence Slater PARAS, MD;  Location: [facility] OR;  Service: Gynecology;;   WISDOM TOOTH EXTRACTION     4    SOCIAL HISTORY: Social History   Socioeconomic History   Marital status: Single    Spouse name: Not on file   Number of children: 0   Years of education: Not on file   Highest education level: Not on file  Occupational History   Not on file  Tobacco Use   Smoking status: Never   Smokeless tobacco: Never  Vaping Use   Vaping  status: Never Used  Substance and Sexual Activity   Alcohol use: Yes    Comment: socially, maybe once or twice a month   Drug use: No   Sexual activity: Never  Other Topics Concern   Not on file  Social History Narrative   Not on file   Social Drivers of Health   Financial Resource Strain: Not on file  Food Insecurity: No Food Insecurity (04/04/2024)   Hunger Vital Sign    Worried About Running Out of Food in the Last Year: Never true    Ran Out of Food in the Last Year: Never true  Transportation Needs:  No Transportation Needs (04/04/2024)   PRAPARE - Administrator, Civil Service (Medical): No    Lack of Transportation (Non-Medical): No  Physical Activity: Not on file  Stress: Not on file  Social Connections: Not on file  Intimate Partner Violence: Not At Risk (04/04/2024)   Humiliation, Afraid, Rape, and Kick questionnaire    Fear of Current or Ex-Partner: No    Emotionally Abused: No    Physically Abused: No    Sexually Abused: No    FAMILY HISTORY: Family History  Problem Relation Age of Onset   Arthritis Mother    Cancer Mother        ovarian   Depression Mother    Diabetes Mother    Hypertension Mother    Obesity Mother    Alcohol abuse Father    Arthritis Father    Depression Father    Arthritis Sister    Depression Sister    Cancer Maternal Aunt        breast   Depression Maternal Aunt    Early death Maternal Grandmother    Obesity Maternal Grandmother    Cancer Maternal Grandmother        Lung    Diabetes Maternal Grandfather    Hypertension Maternal Grandfather    Arthritis Paternal Grandmother    Depression Paternal Grandmother    Diabetes Paternal Grandmother    Hypertension Paternal Grandmother    Cancer Paternal Grandfather        prostate cancer   Hyperlipidemia Paternal Grandfather    Stroke Paternal Grandfather     ALLERGIES:  is allergic to chocolate and pork-derived products.  MEDICATIONS:  Current Outpatient Medications  Medication Sig Dispense Refill   acetaminophen  (TYLENOL ) 500 MG tablet Take 1 tablet (500 mg total) by mouth every 6 (six) hours as needed. 30 tablet 0   ALPRAZolam  (XANAX ) 0.25 MG tablet Take 0.25 mg by mouth 2 (two) times daily as needed for anxiety.     aspirin  EC 325 MG tablet Take 1 tablet (325 mg total) by mouth daily. 30 tablet 1   busPIRone  (BUSPAR ) 7.5 MG tablet Take 1 tablet (7.5 mg total) by mouth 2 (two) times daily. For anxiety 60 tablet 3   Cholecalciferol (VITAMIN D3) 125 MCG (5000 UT) TABS Take  5,000 Units by mouth at bedtime.     cyanocobalamin  1000 MCG tablet Take 1 tablet (1,000 mcg total) by mouth daily. 30 tablet 0   ferrous sulfate  325 (65 FE) MG EC tablet Take 1 tablet (325 mg total) by mouth every other day. 45 tablet 3   folic acid  (FOLVITE ) 1 MG tablet Take 1 tablet (1 mg total) by mouth daily. 30 tablet 1   Multiple Vitamins-Minerals (AIRBORNE ELDERBERRY) CHEW Chew 1 capsule by mouth every other day.     norethindrone  (AYGESTIN ) 5 MG tablet Take 2 tablets (  10 mg total) by mouth daily. 30 tablet 1   Prenatal Vit-Fe Fumarate-FA (PRENATAL MULTIVITAMIN) TABS tablet Take 1 tablet by mouth daily at 12 noon. 30 tablet 1   No current facility-administered medications for this visit.    REVIEW OF SYSTEMS:   Constitutional: Denies fevers, chills or abnormal night sweats Eyes: Denies blurriness of vision, double vision or watery eyes Ears, nose, mouth, throat, and face: Denies mucositis or sore throat Respiratory: Denies cough, dyspnea or wheezes Cardiovascular: Denies palpitation, chest discomfort or lower extremity swelling Gastrointestinal:  Denies nausea, heartburn or change in bowel habits Skin: Denies abnormal skin rashes Lymphatics: Denies new lymphadenopathy or easy bruising Neurological:Denies numbness, tingling or new weaknesses Behavioral/Psych: Mood is stable, no new changes  All other systems were reviewed with the patient and are negative.  PHYSICAL EXAMINATION: ECOG PERFORMANCE STATUS: 1 - Symptomatic but completely ambulatory  Vitals:   04/19/24 0922  BP: (!) 152/84  Pulse: 79  Resp: 18  Temp: 97.8 F (36.6 C)  SpO2: 98%   Filed Weights   04/19/24 0922  Weight: (!) 349 lb 6.9 oz (158.5 kg)    GENERAL:alert, no distress and comfortable SKIN: skin color, texture, turgor are normal, no rashes or significant lesions EYES: normal, conjunctiva are pink and non-injected, sclera clear OROPHARYNX:no exudate, no erythema and lips, buccal mucosa, and tongue  normal  NECK: supple, thyroid  normal size, non-tender, without nodularity LYMPH:  no palpable lymphadenopathy in the cervical, axillary or inguinal LUNGS: clear to auscultation and percussion with normal breathing effort HEART: regular rate & rhythm and no murmurs and no lower extremity edema ABDOMEN:abdomen soft, non-tender and normal bowel sounds Musculoskeletal:no cyanosis of digits and no clubbing  PSYCH: alert & oriented x 3 with fluent speech NEURO: no focal motor/sensory deficits  Physical Exam   LABORATORY DATA:  I have reviewed the data as listed    Latest Ref Rng & Units 04/05/2024    7:21 AM 04/04/2024    9:33 PM 04/04/2024    9:41 AM  CBC  WBC 4.0 - 10.5 K/uL 9.3  12.3  10.9   Hemoglobin 12.0 - 15.0 g/dL 7.2  7.3  6.5   Hematocrit 36.0 - 46.0 % 23.5  22.9  21.0   Platelets 150 - 400 K/uL 332  340  340     @cmpl @  RADIOGRAPHIC STUDIES: I have personally reviewed the radiological images as listed and agreed with the findings in the report. VAS US  LOWER EXTREMITY VENOUS (DVT) Result Date: 04/16/2024  Lower Venous DVT Study Patient Name:  Gloriann Riede Akens  Date of Exam:   04/16/2024 Medical Rec #: [id number]    Accession #:    [id number] Date of Birth: 11-Feb-1991     Patient Gender: F Patient Age:   33 years Exam Location:  [address] Procedure:      VAS US  LOWER EXTREMITY VENOUS (DVT) Referring Phys: DEBBY ROBERTSON --------------------------------------------------------------------------------  Indications: Evaluation of left leg peroneal vein DVT.  Limitations: Body habitus. Performing Technologist: Geni Lodge RVS, RCS  Examination Guidelines: A complete evaluation includes B-mode imaging, spectral Doppler, color Doppler, and power Doppler as needed of all accessible portions of each vessel. Bilateral testing is considered an integral part of a complete examination. Limited examinations for reoccurring indications may be performed as noted. The reflux portion of the exam is  performed with the patient in reverse Trendelenburg.  +---------+---------------+---------+-----------+----------+--------------+ LEFT     CompressibilityPhasicitySpontaneityPropertiesThrombus Aging +---------+---------------+---------+-----------+----------+--------------+ CFV      Full                                                        +---------+---------------+---------+-----------+----------+--------------+  SFJ      Full                                                        +---------+---------------+---------+-----------+----------+--------------+ FV Prox  Full                                                        +---------+---------------+---------+-----------+----------+--------------+ FV Mid   Full                                                        +---------+---------------+---------+-----------+----------+--------------+ FV DistalFull                                                        +---------+---------------+---------+-----------+----------+--------------+ POP      Full                                                        +---------+---------------+---------+-----------+----------+--------------+ PTV      Full                                                        +---------+---------------+---------+-----------+----------+--------------+ PERO     None                                                        +---------+---------------+---------+-----------+----------+--------------+ GSV      Full                                                        +---------+---------------+---------+-----------+----------+--------------+     Summary: RIGHT: - No evidence of common femoral vein obstruction.   LEFT: - Findings consistent with continued acute deep vein thrombosis involving one of the paired peroneal veins. No significant change when compared to the previous exams. - Gastrocnemius veins are patent and compressible.    *See table(s) above for measurements and observations. Electronically signed by Debby Robertson on 04/16/2024 at 4:50:14 PM.    Final    VAS US  LOWER EXTREMITY VENOUS (DVT) Result Date: 04/11/2024  Lower Venous DVT Study Patient Name:  Iliyana Convey Banton  Date of Exam:   04/11/2024 Medical Rec #: [id number]  Accession #:    [id number] Date of Birth: Feb 18, 1991     Patient Gender: F Patient Age:   41 years Exam Location:  [address] Procedure:      VAS US  LOWER EXTREMITY VENOUS (DVT) Referring Phys: DEBBY ROBERTSON --------------------------------------------------------------------------------  Indications: Edema, and history of left peroneal DVT.  Limitations: Body habitus and depth of vessels. Comparison Study: 04/04/2024 Lower extremity venous duplex- acute DVT left                   peroneal veins Performing Technologist: Rosaline Fujisawa MHA, RDMS, RVT, RDCS  Examination Guidelines: A complete evaluation includes B-mode imaging, spectral Doppler, color Doppler, and power Doppler as needed of all accessible portions of each vessel. Bilateral testing is considered an integral part of a complete examination. Limited examinations for reoccurring indications may be performed as noted. The reflux portion of the exam is performed with the patient in reverse Trendelenburg.  +-----+---------------+---------+-----------+----------+--------------+ RIGHTCompressibilityPhasicitySpontaneityPropertiesThrombus Aging +-----+---------------+---------+-----------+----------+--------------+ CFV  Full           Yes      Yes                                 +-----+---------------+---------+-----------+----------+--------------+   +---------+---------------+---------+-----------+----------+--------------+ LEFT     CompressibilityPhasicitySpontaneityPropertiesThrombus Aging +---------+---------------+---------+-----------+----------+--------------+ CFV      Full           Yes      Yes                                  +---------+---------------+---------+-----------+----------+--------------+ SFJ      Full                                                        +---------+---------------+---------+-----------+----------+--------------+ FV Prox  Full                                                        +---------+---------------+---------+-----------+----------+--------------+ FV Mid   Full           Yes      Yes                                 +---------+---------------+---------+-----------+----------+--------------+ FV DistalFull                                                        +---------+---------------+---------+-----------+----------+--------------+ PFV      Full                                                        +---------+---------------+---------+-----------+----------+--------------+ POP      Full  Yes      Yes                                 +---------+---------------+---------+-----------+----------+--------------+ PTV      Full                                                        +---------+---------------+---------+-----------+----------+--------------+ PERO     None                    No                   Acute          +---------+---------------+---------+-----------+----------+--------------+     Summary: RIGHT: - No evidence of common femoral vein obstruction.  LEFT: - Findings consistent with acute deep vein thrombosis involving the left peroneal veins. No significant change when compared to prior study.  - No cystic structure found in the popliteal fossa.  *See table(s) above for measurements and observations. Electronically signed by Gaile New MD on 04/11/2024 at 9:59:18 PM.    Final    VAS US  LOWER EXTREMITY VENOUS (DVT) Result Date: 04/04/2024  Lower Venous DVT Study Patient Name:  TIYA SCHRUPP Horstman  Date of Exam:   04/04/2024 Medical Rec #: [id number]    Accession #:    [id number] Date of Birth: April 02, 1991     Patient Gender: F  Patient Age:   62 years Exam Location:  [facility] Procedure:      VAS US  LOWER EXTREMITY VENOUS (DVT) Referring Phys: DORN DAWSON --------------------------------------------------------------------------------  Indications: Edema.  Limitations: Body habitus and unable to tolerate compression. Comparison Study: 03/28/2024 Left peroneal and gastroc veins DVT Performing Technologist: Elmarie Lindau, RVT  Examination Guidelines: A complete evaluation includes B-mode imaging, spectral Doppler, color Doppler, and power Doppler as needed of all accessible portions of each vessel. Bilateral testing is considered an integral part of a complete examination. Limited examinations for reoccurring indications may be performed as noted. The reflux portion of the exam is performed with the patient in reverse Trendelenburg.  +---------+---------------+---------+-----------+----------+-------------------+ LEFT     CompressibilityPhasicitySpontaneityPropertiesThrombus Aging      +---------+---------------+---------+-----------+----------+-------------------+ CFV      Full           Yes      Yes                                      +---------+---------------+---------+-----------+----------+-------------------+ SFJ      Full                                                             +---------+---------------+---------+-----------+----------+-------------------+ FV Mid                  Yes                                               +---------+---------------+---------+-----------+----------+-------------------+  FV Distal               Yes                                               +---------+---------------+---------+-----------+----------+-------------------+ PFV      Full                                                             +---------+---------------+---------+-----------+----------+-------------------+ POP      Full           Yes      Yes                                       +---------+---------------+---------+-----------+----------+-------------------+ PTV      Full                                                             +---------+---------------+---------+-----------+----------+-------------------+ PERO     None                                         1 of 2 veins acute                                                        appearing           +---------+---------------+---------+-----------+----------+-------------------+ Gastroc  Full                                         proximal            +---------+---------------+---------+-----------+----------+-------------------+     Summary: LEFT: - Findings consistent with acute deep vein thrombosis involving the left peroneal veins.  - Persistent acute appearing deep vein thrombosis in 1 of 2 peroneal veins.  *See table(s) above for measurements and observations. Electronically signed by Debby Robertson on 04/04/2024 at 9:16:29 PM.    Final    VAS US  LOWER EXTREMITY VENOUS (DVT) (7a-7p) Result Date: 03/28/2024  Lower Venous DVT Study Patient Name:  AVALON COPPINGER Henshaw  Date of Exam:   03/28/2024 Medical Rec #: [id number]    Accession #:    [id number] Date of Birth: 12/23/1990     Patient Gender: F Patient Age:   7 years Exam Location:  [facility] Procedure:      VAS US  LOWER EXTREMITY VENOUS (DVT) Referring Phys: BLEASE DOUTOVA --------------------------------------------------------------------------------  Indications: Swelling.  Risk Factors: None identified. Limitations: Body habitus and poor ultrasound/tissue interface. Comparison Study: No prior studies. Performing Technologist: Cordella Collet  RVT  Examination Guidelines: A complete evaluation includes B-mode imaging, spectral Doppler, color Doppler, and power Doppler as needed of all accessible portions of each vessel. Bilateral testing is considered an integral part of a complete examination. Limited examinations for  reoccurring indications may be performed as noted. The reflux portion of the exam is performed with the patient in reverse Trendelenburg.  +-----+---------------+---------+-----------+----------+--------------+ RIGHTCompressibilityPhasicitySpontaneityPropertiesThrombus Aging +-----+---------------+---------+-----------+----------+--------------+ CFV  Full           Yes      Yes                                 +-----+---------------+---------+-----------+----------+--------------+   +---------+---------------+---------+-----------+----------+--------------+ LEFT     CompressibilityPhasicitySpontaneityPropertiesThrombus Aging +---------+---------------+---------+-----------+----------+--------------+ CFV      Full           Yes      Yes                                 +---------+---------------+---------+-----------+----------+--------------+ SFJ      Full                                                        +---------+---------------+---------+-----------+----------+--------------+ FV Prox  Full                                                        +---------+---------------+---------+-----------+----------+--------------+ FV Mid   Full                                                        +---------+---------------+---------+-----------+----------+--------------+ FV DistalFull                                                        +---------+---------------+---------+-----------+----------+--------------+ PFV      Full                                                        +---------+---------------+---------+-----------+----------+--------------+ POP      Full           Yes      Yes                                 +---------+---------------+---------+-----------+----------+--------------+ PTV      Full                                                        +---------+---------------+---------+-----------+----------+--------------+  PERO     Partial                                      Acute          +---------+---------------+---------+-----------+----------+--------------+ Gastroc  None                                         Acute          +---------+---------------+---------+-----------+----------+--------------+    Summary: RIGHT: - No evidence of common femoral vein obstruction.   LEFT: - Findings consistent with acute deep vein thrombosis involving the left peroneal veins. Findings consistent with acute intramuscular thrombosis involving the left gastrocnemius veins.  *See table(s) above for measurements and observations. Electronically signed by Gaile New MD on 03/28/2024 at 7:13:01 PM.    Final    CT Angio Chest PE W and/or Wo Contrast Result Date: 03/28/2024 CLINICAL DATA:  Elevated D-dimer and shortness of breath, initial encounter EXAM: CT ANGIOGRAPHY CHEST WITH CONTRAST TECHNIQUE: Multidetector CT imaging of the chest was performed using the standard protocol during bolus administration of intravenous contrast. Multiplanar CT image reconstructions and MIPs were obtained to evaluate the vascular anatomy. RADIATION DOSE REDUCTION: This exam was performed according to the departmental dose-optimization program which includes automated exposure control, adjustment of the mA and/or kV according to patient size and/or use of iterative reconstruction technique. CONTRAST:  75mL OMNIPAQUE  IOHEXOL  350 MG/ML SOLN COMPARISON:  Chest x-ray from the previous day. 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 No aneurysmal dilatation or dissection is seen. The heart is mildly enlarged in size. The pulmonary artery shows no evidence of pulmonary emboli. Mediastinum/Nodes: Thoracic inlet is within normal limits. No hilar or mediastinal adenopathy is noted. The esophagus as visualized is within normal limits. Lungs/Pleura: Lungs are clear. No pleural effusion or pneumothorax.  Upper Abdomen: Visualized upper abdomen is within normal limits. Musculoskeletal: Mild degenerative changes of the thoracic spine are noted. No acute abnormality noted. Review of the MIP images confirms the above findings. IMPRESSION: No evidence of pulmonary emboli. No acute abnormality seen. Electronically Signed   By: Oneil Devonshire M.D.   On: 03/28/2024 02:28   DG Chest 2 View Result Date: 03/27/2024 CLINICAL DATA:  Shortness of breath. EXAM: CHEST - 2 VIEW COMPARISON:  Radiograph and CT 09/20/2023 FINDINGS: The cardiomediastinal contours are normal. The lungs are clear. Pulmonary vasculature is normal. No consolidation, pleural effusion, or pneumothorax. No acute osseous abnormalities are seen. IMPRESSION: No active cardiopulmonary disease. Electronically Signed   By: Andrea Gasman M.D.   On: 03/27/2024 20:53     Assessment & Plan Iron  deficiency anemia due to chronic blood loss Chronic iron  deficiency anemia secondary to heavy menstrual bleeding, exacerbated post-DNC IUD hysteroscopy in May 2025. Hemoglobin levels have been consistently low, with a significant drop to 6.0 g/dL in August 2025, necessitating multiple blood transfusions. Oral iron  supplementation has been insufficient due to ongoing blood loss. Ferritin levels remain low, indicating inadequate iron  stores. 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 B12 levels were low in August 2025 despite oral supplementation, contributing to anemia and associated symptoms. - Check B12 levels today - Administer B12 injections if levels remain below 300 pg/mL - Monitor B12  levels every 4 months - Continue oral B12 supplementation if levels are adequate  Provoked left lower extremity deep vein thrombosis Provoked DVT in the left lower extremity, diagnosed in August 2025, likely secondary to high-dose progesterone therapy. The DVT is located in the left peritoneum vein and left gastrocnemius vein. Anticoagulation therapy with Eliquis  was initiated but discontinued due to exacerbation of bleeding. Current management includes aspirin  325 mg daily. Weekly ultrasounds show no change in clot size, but no growth or migration. The risk of further anticoagulation is weighed against the risk of bleeding due to anemia. - Continue aspirin  325 mg daily - Perform weekly ultrasounds to monitor DVT - Encourage regular physical activity and weight management - Discuss potential for low-dose anticoagulation if anemia improves   Plan - Will repeat labs today including CBC, ferritin, B12 and folate level - Schedule Venofer  500 mg weekly x 2 in the next 2 to 3 weeks -Consider B12 injection if level below 300 - Repeat lab in 2 weeks - Continue aspirin  325 mg daily, she unfortunate cannot tolerate anticoagulation due to menorrhagia and severe anemia - She will follow-up with Dr. Davonna in a month  Orders Placed This Encounter  Procedures   Folate RBC    Standing Status:   Future    Expected Date:   04/19/2024    Expiration Date:   04/19/2025   Retic Panel    Standing Status:   Future    Expected Date:   04/19/2024    Expiration Date:   04/19/2025   CBC with Differential/Platelet    Standing Status:   Future    Expected Date:   04/19/2024    Expiration Date:   04/19/2025   Ferritin    Standing Status:   Future    Expected Date:   04/19/2024    Expiration Date:   04/19/2025   Vitamin B12    Standing Status:   Future    Expected Date:   04/19/2024    Expiration Date:   04/19/2025    All questions were answered. The patient knows to call the clinic with any problems, questions or  concerns. I spent 35 minutes counseling the patient face to face. The total time spent in the appointment was 45 minutes including review of chart and various tests results, discussions about plan of care and coordination of care plan.     Onita Mattock, MD 04/19/2024 9:47 AM

## 2024-04-21 ENCOUNTER — Encounter: Payer: Self-pay | Admitting: Hematology

## 2024-04-22 ENCOUNTER — Inpatient Hospital Stay

## 2024-04-22 VITALS — BP 118/72 | HR 55 | Temp 97.0°F | Resp 19

## 2024-04-22 DIAGNOSIS — D500 Iron deficiency anemia secondary to blood loss (chronic): Secondary | ICD-10-CM | POA: Diagnosis not present

## 2024-04-22 DIAGNOSIS — D509 Iron deficiency anemia, unspecified: Secondary | ICD-10-CM

## 2024-04-22 LAB — FOLATE RBC
Folate, Hemolysate: 562 ng/mL
Folate, RBC: 1925 ng/mL (ref >498)
Hematocrit: 29.2 % — ABNORMAL LOW (ref 34.0–46.6)

## 2024-04-22 MED ORDER — SODIUM CHLORIDE 0.9 % IV SOLN
500.0000 mg | Freq: Once | INTRAVENOUS | Status: AC
  Administered 2024-04-22: 500 mg via INTRAVENOUS
  Filled 2024-04-22: qty 20

## 2024-04-22 MED ORDER — SODIUM CHLORIDE 0.9 % IV SOLN: Freq: Once | INTRAVENOUS | Status: AC

## 2024-04-22 NOTE — Patient Instructions (Signed)

## 2024-04-22 NOTE — Progress Notes (Signed)
 Patient took tylenol  and Claritin prior to appt. Treatment team made aware.   Patient tolerated iron  infusion with no complaints voiced.  Peripheral IV site clean and dry with good blood return noted before and after infusion.  Pt observed for 30 minutes post iron  without any complications.  VSS with discharge and left in satisfactory condition with no s/s of distress noted. All follow ups as scheduled.   Alexandra Newton

## 2024-04-23 ENCOUNTER — Ambulatory Visit (INDEPENDENT_AMBULATORY_CARE_PROVIDER_SITE_OTHER)

## 2024-04-23 DIAGNOSIS — I82452 Acute embolism and thrombosis of left peroneal vein: Secondary | ICD-10-CM | POA: Diagnosis not present

## 2024-04-29 ENCOUNTER — Inpatient Hospital Stay

## 2024-04-29 VITALS — BP 109/74 | HR 71 | Temp 97.8°F | Resp 20

## 2024-04-29 DIAGNOSIS — D509 Iron deficiency anemia, unspecified: Secondary | ICD-10-CM

## 2024-04-29 DIAGNOSIS — D500 Iron deficiency anemia secondary to blood loss (chronic): Secondary | ICD-10-CM | POA: Diagnosis not present

## 2024-04-29 MED ORDER — SODIUM CHLORIDE 0.9 % IV SOLN
500.0000 mg | Freq: Once | INTRAVENOUS | Status: AC
  Administered 2024-04-29: 500 mg via INTRAVENOUS
  Filled 2024-04-29: qty 25

## 2024-04-29 MED ORDER — SODIUM CHLORIDE 0.9 % IV SOLN: INTRAVENOUS | Status: DC

## 2024-04-29 NOTE — Progress Notes (Signed)
 Venofer  500 mg iron  infusion given per orders. Patient tolerated it well without problems. Vitals stable and discharged home from clinic ambulatory. Follow up as scheduled.

## 2024-04-29 NOTE — Progress Notes (Signed)
Patient took own pre-meds.

## 2024-04-29 NOTE — Patient Instructions (Addendum)
 [facility] [place] - A DEPT OF [facility]  Discharge Instructions: Thank you for choosing [facility] to provide your oncology and hematology care.  If you have a lab appointment with the Cancer Center - please note that after April 8th, 2024, all labs will be drawn in the cancer center.  You do not have to check in or register with the main entrance as you have in the past but will complete your check-in in the cancer center.  Wear comfortable clothing and clothing appropriate for easy access to any Portacath or PICC line.   We strive to give you quality time with your provider. You may need to reschedule your appointment if you arrive late (15 or more minutes).  Arriving late affects you and other patients whose appointments are after yours.  Also, if you miss three or more appointments without notifying the office, you may be dismissed from the clinic at the provider's discretion.      For prescription refill requests, have your pharmacy contact our office and allow 72 hours for refills to be completed.    Today you received the following iron infusion: Venofer 500 mg   To help prevent nausea and vomiting after your treatment, we encourage you to take your nausea medication as directed.  BELOW ARE SYMPTOMS THAT SHOULD BE REPORTED IMMEDIATELY: *FEVER GREATER THAN 100.4 F (38 C) OR HIGHER *CHILLS OR SWEATING *NAUSEA AND VOMITING THAT IS NOT CONTROLLED WITH YOUR NAUSEA MEDICATION *UNUSUAL SHORTNESS OF BREATH *UNUSUAL BRUISING OR BLEEDING *URINARY PROBLEMS (pain or burning when urinating, or frequent urination) *BOWEL PROBLEMS (unusual diarrhea, constipation, pain near the anus) TENDERNESS IN MOUTH AND THROAT WITH OR WITHOUT PRESENCE OF ULCERS (sore throat, sores in mouth, or a toothache) UNUSUAL RASH, SWELLING OR PAIN  UNUSUAL VAGINAL DISCHARGE OR ITCHING   Items with * indicate a potential emergency and should be followed up as soon as possible or  go to the Emergency Department if any problems should occur.  Please show the CHEMOTHERAPY ALERT CARD or IMMUNOTHERAPY ALERT CARD at check-in to the Emergency Department and triage nurse.  Should you have questions after your visit or need to cancel or reschedule your appointment, please contact [facility] [place] - A DEPT OF [facility] [phone]  and follow the prompts.  Office hours are 8:00 a.m. to 4:30 p.m. Monday - Friday. Please note that voicemails left after 4:00 p.m. may not be returned until the following business day.  We are closed weekends and major holidays. You have access to a nurse at all times for urgent questions. Please call the main number to the clinic [phone] and follow the prompts.  For any non-urgent questions, you may also contact your provider using MyChart. We now offer e-Visits for anyone 6 and older to request care online for non-urgent symptoms. For details visit [email or web address].   Also download the MyChart app! Go to the app store, search "MyChart", open the app, select [place], and log in with your MyChart username and password.

## 2024-04-30 ENCOUNTER — Ambulatory Visit (INDEPENDENT_AMBULATORY_CARE_PROVIDER_SITE_OTHER)

## 2024-04-30 DIAGNOSIS — I82452 Acute embolism and thrombosis of left peroneal vein: Secondary | ICD-10-CM

## 2024-05-02 ENCOUNTER — Other Ambulatory Visit: Payer: Self-pay

## 2024-05-02 DIAGNOSIS — D509 Iron deficiency anemia, unspecified: Secondary | ICD-10-CM

## 2024-05-02 DIAGNOSIS — E538 Deficiency of other specified B group vitamins: Secondary | ICD-10-CM

## 2024-05-03 ENCOUNTER — Inpatient Hospital Stay

## 2024-05-07 ENCOUNTER — Encounter

## 2024-05-14 ENCOUNTER — Encounter

## 2024-05-15 ENCOUNTER — Inpatient Hospital Stay: Attending: Oncology

## 2024-05-15 ENCOUNTER — Inpatient Hospital Stay: Admitting: Oncology

## 2024-05-15 VITALS — BP 115/72 | HR 69 | Temp 98.1°F | Resp 18 | Ht 67.0 in | Wt 350.0 lb

## 2024-05-15 DIAGNOSIS — Z79899 Other long term (current) drug therapy: Secondary | ICD-10-CM | POA: Diagnosis not present

## 2024-05-15 DIAGNOSIS — F419 Anxiety disorder, unspecified: Secondary | ICD-10-CM | POA: Diagnosis not present

## 2024-05-15 DIAGNOSIS — Z8042 Family history of malignant neoplasm of prostate: Secondary | ICD-10-CM | POA: Insufficient documentation

## 2024-05-15 DIAGNOSIS — Z7901 Long term (current) use of anticoagulants: Secondary | ICD-10-CM | POA: Diagnosis not present

## 2024-05-15 DIAGNOSIS — Z7982 Long term (current) use of aspirin: Secondary | ICD-10-CM | POA: Insufficient documentation

## 2024-05-15 DIAGNOSIS — I82462 Acute embolism and thrombosis of left calf muscular vein: Secondary | ICD-10-CM | POA: Insufficient documentation

## 2024-05-15 DIAGNOSIS — Z803 Family history of malignant neoplasm of breast: Secondary | ICD-10-CM | POA: Insufficient documentation

## 2024-05-15 DIAGNOSIS — D509 Iron deficiency anemia, unspecified: Secondary | ICD-10-CM

## 2024-05-15 DIAGNOSIS — Z86718 Personal history of other venous thrombosis and embolism: Secondary | ICD-10-CM | POA: Diagnosis not present

## 2024-05-15 DIAGNOSIS — I824Z2 Acute embolism and thrombosis of unspecified deep veins of left distal lower extremity: Secondary | ICD-10-CM | POA: Diagnosis not present

## 2024-05-15 DIAGNOSIS — F32A Depression, unspecified: Secondary | ICD-10-CM | POA: Diagnosis not present

## 2024-05-15 DIAGNOSIS — E538 Deficiency of other specified B group vitamins: Secondary | ICD-10-CM | POA: Diagnosis not present

## 2024-05-15 DIAGNOSIS — R519 Headache, unspecified: Secondary | ICD-10-CM | POA: Insufficient documentation

## 2024-05-15 DIAGNOSIS — K1379 Other lesions of oral mucosa: Secondary | ICD-10-CM | POA: Insufficient documentation

## 2024-05-15 DIAGNOSIS — Z801 Family history of malignant neoplasm of trachea, bronchus and lung: Secondary | ICD-10-CM | POA: Insufficient documentation

## 2024-05-15 DIAGNOSIS — N920 Excessive and frequent menstruation with regular cycle: Secondary | ICD-10-CM | POA: Diagnosis not present

## 2024-05-15 DIAGNOSIS — Z79890 Hormone replacement therapy (postmenopausal): Secondary | ICD-10-CM | POA: Insufficient documentation

## 2024-05-15 DIAGNOSIS — R0602 Shortness of breath: Secondary | ICD-10-CM | POA: Insufficient documentation

## 2024-05-15 DIAGNOSIS — D500 Iron deficiency anemia secondary to blood loss (chronic): Secondary | ICD-10-CM | POA: Diagnosis present

## 2024-05-15 LAB — CBC WITH DIFFERENTIAL/PLATELET
Abs Immature Granulocytes: 0.01 K/uL (ref 0.00–0.07)
Basophils Absolute: 0 K/uL (ref 0.0–0.1)
Basophils Relative: 1 %
Eosinophils Absolute: 0.1 K/uL (ref 0.0–0.5)
Eosinophils Relative: 1 %
HCT: 37.6 % (ref 36.0–46.0)
Hemoglobin: 11.8 g/dL — ABNORMAL LOW (ref 12.0–15.0)
Immature Granulocytes: 0 %
Lymphocytes Relative: 29 %
Lymphs Abs: 1.8 K/uL (ref 0.7–4.0)
MCH: 28.2 pg (ref 26.0–34.0)
MCHC: 31.4 g/dL (ref 30.0–36.0)
MCV: 89.7 fL (ref 80.0–100.0)
Monocytes Absolute: 0.5 K/uL (ref 0.1–1.0)
Monocytes Relative: 8 %
Neutro Abs: 3.7 K/uL (ref 1.7–7.7)
Neutrophils Relative %: 61 %
Platelets: 384 K/uL (ref 150–400)
RBC: 4.19 MIL/uL (ref 3.87–5.11)
RDW: 16 % — ABNORMAL HIGH (ref 11.5–15.5)
WBC: 6.1 K/uL (ref 4.0–10.5)
nRBC: 0 % (ref 0.0–0.2)

## 2024-05-15 LAB — IRON AND TIBC
Iron: 43 ug/dL (ref 28–170)
Saturation Ratios: 12 % (ref 10.4–31.8)
TIBC: 353 ug/dL (ref 250–450)
UIBC: 310 ug/dL

## 2024-05-15 LAB — RETIC PANEL
Immature Retic Fract: 7.2 % (ref 2.3–15.9)
RBC.: 4.16 MIL/uL (ref 3.87–5.11)
Retic Count, Absolute: 41.2 K/uL (ref 19.0–186.0)
Retic Ct Pct: 1 % (ref 0.4–3.1)
Reticulocyte Hemoglobin: 31.6 pg (ref >27.9)

## 2024-05-15 LAB — SAMPLE TO BLOOD BANK

## 2024-05-15 LAB — FOLATE: Folate: >20 ng/mL (ref >5.9)

## 2024-05-15 LAB — VITAMIN B12: Vitamin B-12: 541 pg/mL (ref 180–914)

## 2024-05-15 LAB — FERRITIN: Ferritin: 238 ng/mL (ref 11–307)

## 2024-05-15 NOTE — Assessment & Plan Note (Addendum)
 Iron  deficiency anemia due to chronic blood loss Chronic iron  deficiency anemia secondary to heavy menstrual bleeding, exacerbated post-DNC IUD hysteroscopy in May 2025. Hemoglobin levels have been consistently low, with a significant drop to 6.0 g/dL in August 2025, necessitating multiple blood transfusions. Oral iron  supplementation has been insufficient due to ongoing blood loss. Ferritin levels remain low, indicating inadequate iron  stores. She received 2 doses of IV Venofer  on 04/22/2024 and 04/29/2024 with good tolerance. Repeat CBC shows improvement of her hemoglobin to 11.6 with normalizing reticulocyte counts.  Iron  levels show iron  saturation of 12% with normal TIBC.  Ferritin 238. She is scheduled to have a repeat D&C with IUD placement on 05/27/2024. We discussed going ahead and scheduling her for at least 1 additional dose of iron  prior to this given increased menstrual bleeding following the procedure back in May. Return to clinic in 10 weeks with labs and virtual visit.

## 2024-05-15 NOTE — Assessment & Plan Note (Addendum)
 Provoked left lower extremity deep vein thrombosis.  Provoked DVT in the left lower extremity, diagnosed in August 2025, likely secondary to high-dose progesterone therapy. The DVT is located in the left peritoneum vein and left gastrocnemius vein. Anticoagulation therapy with Eliquis  was initiated but discontinued due to exacerbation of bleeding. Current management includes aspirin  325 mg daily. Weekly ultrasounds show no change in clot size, but no growth or migration. The risk of further anticoagulation is weighed against the risk of bleeding due to anemia. She continues aspirin  325 mg daily. She was followed by vein and vascular Dr. Daron for weekly ultrasounds but eventually these were stopped due to more or less stable ultrasounds.  There was no signs of worsening or migration of the clot. Should hemoglobin continue to improve, low-dose anticoagulation could be initiated.

## 2024-05-15 NOTE — Patient Instructions (Signed)
.  rdc

## 2024-05-15 NOTE — Assessment & Plan Note (Addendum)
 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 B12 levels were low in August 2025 despite oral supplementation, contributing to anemia and associated symptoms. She received 2 injections of B12 while in the hospital in August. Repeat B12 levels are 541.  Continue B12 oral supplementation.

## 2024-05-15 NOTE — Progress Notes (Signed)
 [facility] OFFICE PROGRESS NOTE  Waylan Almarie SAUNDERS, MD  ASSESSMENT & PLAN:    Assessment & Plan Iron  deficiency anemia, unspecified iron  deficiency anemia type Iron  deficiency anemia due to chronic blood loss Chronic iron  deficiency anemia secondary to heavy menstrual bleeding, exacerbated post-DNC IUD hysteroscopy in May 2025. Hemoglobin levels have been consistently low, with a significant drop to 6.0 g/dL in August 2025, necessitating multiple blood transfusions. Oral iron  supplementation has been insufficient due to ongoing blood loss. Ferritin levels remain low, indicating inadequate iron  stores. She received 2 doses of IV Venofer  on 04/22/2024 and 04/29/2024 with good tolerance. Repeat CBC shows improvement of her hemoglobin to 11.6 with normalizing reticulocyte counts.  Iron  levels show iron  saturation of 12% with normal TIBC.  Ferritin 238. She is scheduled to have a repeat D&C with IUD placement on 05/27/2024. We discussed going ahead and scheduling her for at least 1 additional dose of iron  prior to this given increased menstrual bleeding following the procedure back in May. Return to clinic in 10 weeks with labs and virtual visit. 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 B12 levels were low in August 2025 despite oral supplementation, contributing to anemia and associated symptoms. She received 2 injections of B12 while in the hospital in August. Repeat B12 levels are 541.  Continue B12 oral supplementation.  Acute deep vein thrombosis (DVT) of distal end of left lower extremity (HCC) Provoked left lower extremity deep vein thrombosis.  Provoked DVT in the left lower extremity, diagnosed in August 2025, likely secondary to high-dose progesterone therapy. The DVT is located in the left peritoneum vein and left gastrocnemius vein.  Anticoagulation therapy with Eliquis  was initiated but discontinued due to exacerbation of bleeding. Current management includes aspirin  325 mg daily. Weekly ultrasounds show no change in clot size, but no growth or migration. The risk of further anticoagulation is weighed against the risk of bleeding due to anemia. She continues aspirin  325 mg daily. She was followed by vein and vascular Dr. Daron for weekly ultrasounds but eventually these were stopped due to more or less stable ultrasounds.  There was no signs of worsening or migration of the clot. Should hemoglobin continue to improve, low-dose anticoagulation could be initiated.  Orders Placed This Encounter  Procedures   CBC with Differential    Standing Status:   Future    Expected Date:   07/24/2024    Expiration Date:   10/22/2024   Ferritin    Standing Status:   Future    Expected Date:   07/24/2024    Expiration Date:   10/22/2024   Iron  and TIBC ([facility] ONLY)    Standing Status:   Future    Expected Date:   07/24/2024    Expiration Date:   10/22/2024   Vitamin B12    Standing Status:   Future    Expected Date:   07/24/2024    Expiration Date:   10/22/2024   Folate    Standing Status:   Future    Expected Date:   07/24/2024    Expiration Date:   10/22/2024   Retic Panel    Standing Status:   Future    Expected Date:   07/24/2024    Expiration Date:   10/22/2024   Sample to Blood Bank    Standing Status:   Future    Expected Date:  07/24/2024    Expiration Date:   10/22/2024    INTERVAL HISTORY: Patient returns for follow-up for anemia, B12 deficiency and recent blood clot.  Patient recently received 2 doses of IV Venofer  on 04/22/2024 and 04/29/2024 with good tolerance.  She also received 2 B12 shots 1 on 04/04/2024 and 1 on 04/05/2024.  She is on oral B12 supplementation as well as prenatals with iron ..  Reports she continues to have menstrual bleeding although it is better and heavy than before.  She is  scheduled to have another D&C with IUD placement on 05/27/2024 but has concerns that the bleeding will increased like it did in May.  Reports she continues the progesterone 2 tablets daily which is a reduction from 3/day when she was diagnosed with the blood clot.  Denies any new concerns for a clot.  Reports she continues to take aspirin  325 mg daily.  She was being followed by vein and vascular and receiving weekly Dopplers to assess for changes to her clot but given stability over several months, Dr. Ishmael released her.   Reports shortness of breath at times.  She has headaches and chronic stable anxiety/depression.  Her energy levels have improved slightly since the iron  infusion and are now 33%.  Appetite is 50%.  We reviewed CBC, CMP, B12, ferritin and iron  panel.  SUMMARY OF HEMATOLOGIC HISTORY: Oncology History Overview Note  Jonica Bickhart Vicars is a 33 year old female who presents for a new consult for DVT and anemia. She was referred by her doctor for evaluation of blood clots and anemia.   She developed a DVT in her left leg in August 2025 while on high-dose progesterone. An ultrasound confirmed the diagnosis. She initially experienced cramp-like pain in her leg, which has mostly resolved but occasionally causes discomfort when flexing her foot. She was started on Eliquis  but discontinued it due to exacerbated bleeding and is currently taking aspirin  325 mg daily.   Her anemia worsened after a D&C, IUD placement, and hysteroscopy in May 2025, leading to continuous bleeding until August 2025. Her hemoglobin dropped to 6.0 g/dL, requiring five blood transfusions. She has been on oral iron  supplements and received three IV iron  infusions, the last in August 2025. Despite this, she continues to experience low iron  levels, weakness, and difficulty breathing.   Her past medical history includes gastric sleeve surgery in 2017, after which she lost 100 pounds but regained about 40 pounds. She has been  taking iron  supplements since the surgery. Her B12 levels were low in August 2025.   Family history includes ovarian cysts in her mother, lung cancer in her maternal grandmother, and prostate cancer in her paternal grandfather. She underwent genetic testing related to her aunt's breast cancer but is unsure of the results.    No history exists.     CBC    Component Value Date/Time   WBC 6.1 05/15/2024 1102   RBC 4.19 05/15/2024 1102   RBC 4.16 05/15/2024 1101   HGB 11.8 (L) 05/15/2024 1102   HCT 37.6 05/15/2024 1102   HCT 29.2 (L) 04/19/2024 0949   PLT 384 05/15/2024 1102   MCV 89.7 05/15/2024 1102   MCH 28.2 05/15/2024 1102   MCHC 31.4 05/15/2024 1102   RDW 16.0 (H) 05/15/2024 1102   LYMPHSABS 1.8 05/15/2024 1102   MONOABS 0.5 05/15/2024 1102   EOSABS 0.1 05/15/2024 1102   BASOSABS 0.0 05/15/2024 1102       Latest Ref Rng & Units 04/05/2024  7:21 AM 04/04/2024    9:41 AM 04/03/2024    9:34 PM  CMP  Glucose 70 - 99 mg/dL 859  867  896   BUN 6 - 20 mg/dL 6  6  7    Creatinine 0.44 - 1.00 mg/dL 9.32  9.30  9.39   Sodium 135 - 145 mmol/L 136  138  137   Potassium 3.5 - 5.1 mmol/L 3.6  3.1  3.5   Chloride 98 - 111 mmol/L 108  106  105   CO2 22 - 32 mmol/L 21  22    Calcium 8.9 - 10.3 mg/dL 8.2  8.3    Total Protein 6.5 - 8.1 g/dL  5.7    Total Bilirubin 0.0 - 1.2 mg/dL  1.1    Alkaline Phos 38 - 126 U/L  51    AST 15 - 41 U/L  15    ALT 0 - 44 U/L  13       Lab Results  Component Value Date   FERRITIN 238 05/15/2024   VITAMINB12 541 05/15/2024    Vitals:   05/15/24 1119  BP: 115/72  Pulse: 69  Resp: 18  Temp: 98.1 F (36.7 C)  SpO2: 100%    Review of System:  Review of Systems  Constitutional:  Positive for malaise/fatigue.  Respiratory:  Positive for shortness of breath.   Gastrointestinal:  Negative for abdominal pain, blood in stool, constipation and diarrhea.  Neurological:  Positive for dizziness, weakness and headaches.  Psychiatric/Behavioral:   The patient has insomnia.     Physical Exam: Physical Exam Constitutional:      Appearance: Normal appearance. She is obese.  HENT:     Head: Normocephalic and atraumatic.  Eyes:     Pupils: Pupils are equal, round, and reactive to light.  Cardiovascular:     Rate and Rhythm: Normal rate and regular rhythm.     Heart sounds: Normal heart sounds. No murmur heard. Pulmonary:     Effort: Pulmonary effort is normal.     Breath sounds: Normal breath sounds. No wheezing.  Abdominal:     General: Bowel sounds are normal. There is no distension.     Palpations: Abdomen is soft.     Tenderness: There is no abdominal tenderness.  Musculoskeletal:        General: Normal range of motion.     Cervical back: Normal range of motion.  Skin:    General: Skin is warm and dry.     Findings: No rash.  Neurological:     Mental Status: She is alert and oriented to person, place, and time.     Gait: Gait is intact.  Psychiatric:        Mood and Affect: Mood and affect normal.        Cognition and Memory: Memory normal.        Judgment: Judgment normal.      I spent 25 minutes dedicated to the care of this patient (face-to-face and non-face-to-face) on the date of the encounter to include what is described in the assessment and plan.,  Delon Hope, NP 05/15/2024 12:56 PM

## 2024-05-15 NOTE — Assessment & Plan Note (Deleted)
 Endometrial thickening observed on imaging. Scheduled for hysteroscopy and D&C to assess for potential precancerous changes. Thickening may contribute to iron deficiency anemia if associated with abnormal uterine bleeding. - Proceed with scheduled hysteroscopy and D&C on May 16th to evaluate endometrial thickening

## 2024-05-17 ENCOUNTER — Inpatient Hospital Stay: Admitting: Oncology

## 2024-05-17 ENCOUNTER — Inpatient Hospital Stay

## 2024-05-21 ENCOUNTER — Encounter

## 2024-05-21 ENCOUNTER — Inpatient Hospital Stay

## 2024-05-21 VITALS — BP 121/69 | HR 78 | Temp 97.9°F | Resp 20

## 2024-05-21 DIAGNOSIS — D500 Iron deficiency anemia secondary to blood loss (chronic): Secondary | ICD-10-CM | POA: Diagnosis not present

## 2024-05-21 DIAGNOSIS — D509 Iron deficiency anemia, unspecified: Secondary | ICD-10-CM

## 2024-05-21 MED ORDER — SODIUM CHLORIDE 0.9 % IV SOLN: INTRAVENOUS | Status: DC

## 2024-05-21 MED ORDER — SODIUM CHLORIDE 0.9 % IV SOLN
500.0000 mg | Freq: Once | INTRAVENOUS | Status: AC
  Administered 2024-05-21: 500 mg via INTRAVENOUS
  Filled 2024-05-21: qty 25

## 2024-05-21 NOTE — Progress Notes (Signed)
Venofer 500 mg given per orders. Patient tolerated it well without problems. Vitals stable and discharged home from clinic ambulatory. Follow up as scheduled.

## 2024-05-21 NOTE — Patient Instructions (Signed)
 [facility]  - A DEPT OF [facility]  Discharge Instructions: Thank you for choosing [facility] to provide your oncology and hematology care.  If you have a lab appointment with the Cancer Center - please note that after April 8th, 2024, all labs will be drawn in the cancer center.  You do not have to check in or register with the main entrance as you have in the past but will complete your check-in in the cancer center.  Wear comfortable clothing and clothing appropriate for easy access to any Portacath or PICC line.   We strive to give you quality time with your provider. You may need to reschedule your appointment if you arrive late (15 or more minutes).  Arriving late affects you and other patients whose appointments are after yours.  Also, if you miss three or more appointments without notifying the office, you may be dismissed from the clinic at the provider's discretion.      For prescription refill requests, have your pharmacy contact our office and allow 72 hours for refills to be completed.    Today you received the following iron  infusion today. Venofer  500   To help prevent nausea and vomiting after your treatment, we encourage you to take your nausea medication as directed.  BELOW ARE SYMPTOMS THAT SHOULD BE REPORTED IMMEDIATELY: *FEVER GREATER THAN 100.4 F (38 C) OR HIGHER *CHILLS OR SWEATING *NAUSEA AND VOMITING THAT IS NOT CONTROLLED WITH YOUR NAUSEA MEDICATION *UNUSUAL SHORTNESS OF BREATH *UNUSUAL BRUISING OR BLEEDING *URINARY PROBLEMS (pain or burning when urinating, or frequent urination) *BOWEL PROBLEMS (unusual diarrhea, constipation, pain near the anus) TENDERNESS IN MOUTH AND THROAT WITH OR WITHOUT PRESENCE OF ULCERS (sore throat, sores in mouth, or a toothache) UNUSUAL RASH, SWELLING OR PAIN  UNUSUAL VAGINAL DISCHARGE OR ITCHING   Items with * indicate a potential emergency and should be followed up as soon as possible  or go to the Emergency Department if any problems should occur.  Please show the CHEMOTHERAPY ALERT CARD or IMMUNOTHERAPY ALERT CARD at check-in to the Emergency Department and triage nurse.  Should you have questions after your visit or need to cancel or reschedule your appointment, please contact [facility]  - A DEPT OF [facility] [phone]  and follow the prompts.  Office hours are 8:00 a.m. to 4:30 p.m. Monday - Friday. Please note that voicemails left after 4:00 p.m. may not be returned until the following business day.  We are closed weekends and major holidays. You have access to a nurse at all times for urgent questions. Please call the main number to the clinic [phone] and follow the prompts.  For any non-urgent questions, you may also contact your provider using MyChart. We now offer e-Visits for anyone 32 and older to request care online for non-urgent symptoms. For details visit [email or web address].   Also download the MyChart app! Go to the app store, search MyChart, open the app, select [place], and log in with your MyChart username and password.

## 2024-05-23 ENCOUNTER — Other Ambulatory Visit: Payer: Self-pay

## 2024-05-23 ENCOUNTER — Encounter (HOSPITAL_COMMUNITY): Payer: Self-pay | Admitting: Obstetrics and Gynecology

## 2024-05-23 NOTE — H&P (Signed)
 Preoperative History and Physical  Alexandra Newton is an 33 y.o. female. 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 Had increased bleeding after starting anticoagulation for treatment of DVT, was referred to [facility] for robotic hysterectomy. This was not scheduled. Current anticoagulation is full dose aspirin . She has been followed by vascular with ultrasounds. Currently follows with heme.  Pertinent Gynecological History: Menses: irregular cycles Contraception: abstinence DES exposure: unknown Blood transfusions: none Sexually transmitted diseases: no past history Previous GYN Procedures: hysteroscopy,D&C Last pap: normal Date: 2021 OB History: G0  Menstrual History: No LMP recorded.    Past Medical History:  Diagnosis Date   Allergy    seasonal   Anxiety    Anxiety    Borderline anemia    pt had 2 iron  infusions in March/April 2025   Depression    Depression    Phreesia 10/11/2020   History of bronchitis as a child    Panic attacks     Past Surgical History:  Procedure Laterality Date   HYSTEROSCOPY WITH D & C N/A 12/19/2023   Procedure: DILATATION AND CURETTAGE /HYSTEROSCOPY;  Surgeon: Laurence Slater PARAS, MD;  Location: [facility] OR;  Service: Gynecology;  Laterality: N/A;  POSSIBLE MYOSURE LITE   INTRAUTERINE DEVICE (IUD) INSERTION N/A 12/19/2023   Procedure: INSERTION, INTRAUTERINE DEVICE;  Surgeon: Laurence Slater PARAS, MD;  Location: [facility] OR;  Service: Gynecology;  Laterality: N/A;  MIRENA    LAPAROSCOPIC GASTRIC SLEEVE RESECTION N/A 02/22/2016   Procedure: LAPAROSCOPIC GASTRIC SLEEVE RESECTION WITH UPPER ENDO;  Surgeon: Morene Olives, MD;  Location: [facility] ORS;  Service: General;  Laterality: N/AMERL NAIL RESECTION  12/19/2023   Procedure: NAIL RESECTION;  Surgeon: Laurence Slater PARAS, MD;  Location: [facility] OR;  Service: Gynecology;;   WISDOM TOOTH EXTRACTION     4    Family History  Problem Relation Age of Onset   Arthritis Mother     Cancer Mother        ovarian   Depression Mother    Diabetes Mother    Hypertension Mother    Obesity Mother    Alcohol abuse Father    Arthritis Father    Depression Father    Arthritis Sister    Depression Sister    Cancer Maternal Aunt        breast   Depression Maternal Aunt    Early death Maternal Grandmother    Obesity Maternal Grandmother    Cancer Maternal Grandmother        Lung    Diabetes Maternal Grandfather    Hypertension Maternal Grandfather    Arthritis Paternal Grandmother    Depression Paternal Grandmother    Diabetes Paternal Grandmother    Hypertension Paternal Grandmother    Cancer Paternal Grandfather        prostate cancer   Hyperlipidemia Paternal Grandfather    Stroke Paternal Grandfather     Social History:  reports that she has never smoked. She has never used smokeless tobacco. She reports current alcohol use. She reports that she does not use drugs.  Allergies:  Allergies  Allergen Reactions   Chocolate Other (See Comments)    Inflammation (allergy testing completed)   Porcine (Pork) Protein-Containing Drug Products     Inflammation     No medications prior to admission.   ROS otherwise negative     05/21/2024   12:32 PM 05/21/2024    8:12 AM 05/15/2024   11:19 AM  Vitals with  BMI  Height   5' 7  Weight   350 lbs  BMI   54.8  Systolic 121 122 884  Diastolic 69 74 72  Pulse 78 85 69   There were no vitals taken for this visit.  Constitutional:      Appearance: Normal appearance.  HENT:     Head: Normocephalic.  Cardiovascular:     Rate and Rhythm: Normal rate Abdominal:     General: Abdomen is nontender, nondistended Neurological:     Mental Status: She is alert.    Assessment/Plan: Alexandra Newton is an 33 y.o. female. G0 who presents for scheduled surgery for evaluation of irregular cycles and incidentally found thickened endometrial stripe (initially on CT) on US . Plan for hysteroscopy, dilation and curettage, possible  polypectomy, Mirena  IUD insertion. Risks discussed including infection, bleeding, damage to surrounding structures, need for additional procedures, postoperative DVT. All questions answered. Consent signed. Plan discharge from PACU.  Slater JINNY Door 05/23/2024

## 2024-05-23 NOTE — Progress Notes (Signed)
 SDW CALL  Patient was given pre-op instructions over the phone. The opportunity was given for the patient to ask questions. No further questions asked. Patient verbalized understanding of instructions given.  Date & arrival time May 27, 2022 @ 5:30 am.  PCP - Alexandra Almarie SAUNDERS, Alexandra Newton Cardiologist -   PPM/ICD - denies Device Orders - n/a Rep Notified - n/a  Chest x-ray - 03-27-24 EKG - 04-03-24 Stress Test - denies ECHO - denies Cardiac Cath - denies  Sleep Study - denies CPAP - denies  DM -denies  Blood Thinner Instructions:denies Aspirin  Instructions: encouraged to reach out to vascular doctor. Follows with Dr. Magda for hx of DVT.  ERAS Protcol - Clear liquids until 4:30 am   COVID TEST- n/a   Anesthesia review: yes,  hx of DVT  Patient denies shortness of breath, fever, cough and chest pain over the phone call   All instructions explained to the patient, with a verbal understanding of the material. Patient agrees to go over the instructions while at home for a better understanding.

## 2024-05-25 ENCOUNTER — Encounter (HOSPITAL_COMMUNITY): Payer: Self-pay | Admitting: *Deleted

## 2024-05-25 ENCOUNTER — Inpatient Hospital Stay (HOSPITAL_COMMUNITY)
Admission: EM | Admit: 2024-05-25 | Discharge: 2024-05-27 | DRG: 744 | Disposition: A | Discharge status: Home / Self Care | Attending: Obstetrics and Gynecology | Admitting: Obstetrics and Gynecology

## 2024-05-25 ENCOUNTER — Other Ambulatory Visit: Payer: Self-pay

## 2024-05-25 DIAGNOSIS — N938 Other specified abnormal uterine and vaginal bleeding: Principal | ICD-10-CM

## 2024-05-25 DIAGNOSIS — D62 Acute posthemorrhagic anemia: Secondary | ICD-10-CM | POA: Diagnosis present

## 2024-05-25 DIAGNOSIS — D649 Anemia, unspecified: Secondary | ICD-10-CM

## 2024-05-25 DIAGNOSIS — Z7982 Long term (current) use of aspirin: Secondary | ICD-10-CM

## 2024-05-25 DIAGNOSIS — Z30430 Encounter for insertion of intrauterine contraceptive device: Secondary | ICD-10-CM

## 2024-05-25 DIAGNOSIS — N920 Excessive and frequent menstruation with regular cycle: Principal | ICD-10-CM | POA: Diagnosis present

## 2024-05-25 DIAGNOSIS — N8500 Endometrial hyperplasia, unspecified: Secondary | ICD-10-CM

## 2024-05-25 DIAGNOSIS — Z833 Family history of diabetes mellitus: Secondary | ICD-10-CM

## 2024-05-25 DIAGNOSIS — E66813 Obesity, class 3: Secondary | ICD-10-CM | POA: Diagnosis present

## 2024-05-25 DIAGNOSIS — N8501 Benign endometrial hyperplasia: Secondary | ICD-10-CM | POA: Diagnosis present

## 2024-05-25 DIAGNOSIS — N939 Abnormal uterine and vaginal bleeding, unspecified: Secondary | ICD-10-CM | POA: Diagnosis present

## 2024-05-25 DIAGNOSIS — Z86718 Personal history of other venous thrombosis and embolism: Secondary | ICD-10-CM

## 2024-05-25 DIAGNOSIS — Z6841 Body Mass Index (BMI) 40.0 and over, adult: Secondary | ICD-10-CM

## 2024-05-25 DIAGNOSIS — Z8249 Family history of ischemic heart disease and other diseases of the circulatory system: Secondary | ICD-10-CM

## 2024-05-25 DIAGNOSIS — Z79899 Other long term (current) drug therapy: Secondary | ICD-10-CM

## 2024-05-25 LAB — CBC WITH DIFFERENTIAL/PLATELET
Abs Immature Granulocytes: 0.26 K/uL — ABNORMAL HIGH (ref 0.00–0.07)
Basophils Absolute: 0.1 K/uL (ref 0.0–0.1)
Basophils Relative: 0 %
Eosinophils Absolute: 0 K/uL (ref 0.0–0.5)
Eosinophils Relative: 0 %
HCT: 18.9 % — ABNORMAL LOW (ref 36.0–46.0)
Hemoglobin: 5.5 g/dL — CL (ref 12.0–15.0)
Immature Granulocytes: 2 %
Lymphocytes Relative: 24 %
Lymphs Abs: 3.9 K/uL (ref 0.7–4.0)
MCH: 28.9 pg (ref 26.0–34.0)
MCHC: 29.1 g/dL — ABNORMAL LOW (ref 30.0–36.0)
MCV: 99.5 fL (ref 80.0–100.0)
Monocytes Absolute: 0.9 K/uL (ref 0.1–1.0)
Monocytes Relative: 5 %
Neutro Abs: 11.3 K/uL — ABNORMAL HIGH (ref 1.7–7.7)
Neutrophils Relative %: 69 %
Platelets: 397 K/uL (ref 150–400)
RBC: 1.9 MIL/uL — ABNORMAL LOW (ref 3.87–5.11)
RDW: 18.6 % — ABNORMAL HIGH (ref 11.5–15.5)
WBC: 16.3 K/uL — ABNORMAL HIGH (ref 4.0–10.5)
nRBC: 1.2 % — ABNORMAL HIGH (ref 0.0–0.2)

## 2024-05-25 LAB — CBC
HCT: 18.8 % — ABNORMAL LOW (ref 36.0–46.0)
Hemoglobin: 5.7 g/dL — CL (ref 12.0–15.0)
MCH: 28.6 pg (ref 26.0–34.0)
MCHC: 30.3 g/dL (ref 30.0–36.0)
MCV: 94.5 fL (ref 80.0–100.0)
Platelets: 280 K/uL (ref 150–400)
RBC: 1.99 MIL/uL — ABNORMAL LOW (ref 3.87–5.11)
RDW: 18.2 % — ABNORMAL HIGH (ref 11.5–15.5)
WBC: 10.7 K/uL — ABNORMAL HIGH (ref 4.0–10.5)
nRBC: 1.1 % — ABNORMAL HIGH (ref 0.0–0.2)

## 2024-05-25 LAB — URINALYSIS, ROUTINE W REFLEX MICROSCOPIC
Bacteria, UA: NONE SEEN
Bilirubin Urine: NEGATIVE
Glucose, UA: NEGATIVE mg/dL
Ketones, ur: 5 mg/dL — AB
Leukocytes,Ua: NEGATIVE
Nitrite: NEGATIVE
Protein, ur: 100 mg/dL — AB
RBC / HPF: >50 RBC/hpf (ref 0–5)
Specific Gravity, Urine: 1.021 (ref 1.005–1.030)
WBC, UA: >50 WBC/hpf (ref 0–5)
pH: 5 (ref 5.0–8.0)

## 2024-05-25 LAB — COMPREHENSIVE METABOLIC PANEL WITH GFR
ALT: 21 U/L (ref 0–44)
AST: 25 U/L (ref 15–41)
Albumin: 2.8 g/dL — ABNORMAL LOW (ref 3.5–5.0)
Alkaline Phosphatase: 52 U/L (ref 38–126)
Anion gap: 13 (ref 5–15)
BUN: 6 mg/dL (ref 6–20)
CO2: 19 mmol/L — ABNORMAL LOW (ref 22–32)
Calcium: 8.2 mg/dL — ABNORMAL LOW (ref 8.9–10.3)
Chloride: 105 mmol/L (ref 98–111)
Creatinine, Ser: 0.69 mg/dL (ref 0.44–1.00)
GFR, Estimated: >60 mL/min (ref >60)
Glucose, Bld: 133 mg/dL — ABNORMAL HIGH (ref 70–99)
Potassium: 3.4 mmol/L — ABNORMAL LOW (ref 3.5–5.1)
Sodium: 137 mmol/L (ref 135–145)
Total Bilirubin: 0.3 mg/dL (ref 0.0–1.2)
Total Protein: 5.7 g/dL — ABNORMAL LOW (ref 6.5–8.1)

## 2024-05-25 LAB — PREPARE RBC (CROSSMATCH)

## 2024-05-25 LAB — APTT: aPTT: 24 s (ref 24–36)

## 2024-05-25 LAB — HCG, SERUM, QUALITATIVE: Preg, Serum: NEGATIVE

## 2024-05-25 LAB — FIBRINOGEN: Fibrinogen: 383 mg/dL (ref 210–475)

## 2024-05-25 MED ORDER — SODIUM CHLORIDE 0.9 % IV BOLUS
1000.0000 mL | Freq: Once | INTRAVENOUS | Status: AC
  Administered 2024-05-25: 1000 mL via INTRAVENOUS

## 2024-05-25 MED ORDER — SODIUM CHLORIDE 0.9% IV SOLUTION: Freq: Once | INTRAVENOUS | Status: AC

## 2024-05-25 MED ORDER — PRENATAL MULTIVITAMIN CH
1.0000 | ORAL_TABLET | Freq: Every day | ORAL | Status: DC
  Administered 2024-05-26 – 2024-05-27 (×2): 1 via ORAL
  Filled 2024-05-25 (×2): qty 1

## 2024-05-25 NOTE — Progress Notes (Signed)
 Progress Note   Patient now on Alexandra Newton. She has been able to ambulate to the bathroom without dizziness. Bleeding is persistent, but manageable. No LE complaints.   BP 137/64 (BP Location: Right Arm)   Pulse 97   Temp 98.3 F (36.8 C) (Oral)   Resp 16   Ht 5' 7 (1.702 m)   Wt (!) 158.8 kg   LMP 05/25/2024   SpO2 100%   BMI 54.83 kg/m   Reiterated points of current plan. Continue 2 u pRBC transfusion, will recheck post-transfusion CBC tomorrow in preparation for surgery the following day.   Massie Smiles

## 2024-05-25 NOTE — H&P (Signed)
 Gynecology History and Physical   Alexandra Newton is a 33 y.o. female G0 with a history of abnormal uterine bleeding, LLE DVT, and anemia presents to [facility] ED with acute on chronic vaginal bleeding.   Pertinent clinical course includes:  Initial consult for heavy menstrual bleeding and thickened endometrium on CT. Pelvic ultrasound revealed thickened inhomogeneous endometrium measuring 29 mm. 12/19/23: Hysteroscopy, D&C, myosure, Mirena  IUD insertion. 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  Aygestin  and TXA begun for heavy bleeding and anemia. HGB 7.5.  Iron  infusions ordered Progestin therapy continued 03/27/24: LLE DVT, Eliquis  begun. Anemia persistent. CTAP negative for PE. TXA discontinued.  04/03/24: Admitted for anemia (HGB 5.5). Venous US  with some resolution of DVT, vascular was consulted and IVC filter deferred, Eliquis  discontinued with plans for aspirin  and serial US  04/11/24: Repeat US  with stable DVT 04/11/24: [facility] consult to discuss hysterectomy, no scheduled.  04/16/24: Repeat US  with stable DVT. IVC was offered but declined. Progestin use discouraged in order to avoid exacerbation of DVT 04/30/24: Continued recanalization of peroneal vein  Presently, she has been taking aspirin  only, and no progestin for about 1 week.   Today she reports very persistent bleeding, especially in the past 8 days, and worsened in the past two. She denies excessive cramping. She denies tachycardia, chest pain, SOB, but endorses fatigue/exhaustion. She denies lightheadedness, dizziness.    OB History   No obstetric history on file.    Past Medical History:  Diagnosis Date   Allergy    seasonal   Anxiety    Anxiety    Borderline anemia    pt had 2 iron  infusions in March/April 2025   Depression    Depression    Phreesia 10/11/2020    History of bronchitis as a child    Panic attacks    Past Surgical History:  Procedure Laterality Date   HYSTEROSCOPY WITH D & C N/A 12/19/2023   Procedure: DILATATION AND CURETTAGE /HYSTEROSCOPY;  Surgeon: Laurence Slater PARAS, MD;  Location: [facility] OR;  Service: Gynecology;  Laterality: N/A;  POSSIBLE MYOSURE LITE   INTRAUTERINE DEVICE (IUD) INSERTION N/A 12/19/2023   Procedure: INSERTION, INTRAUTERINE DEVICE;  Surgeon: Laurence Slater PARAS, MD;  Location: [facility] OR;  Service: Gynecology;  Laterality: N/A;  MIRENA    LAPAROSCOPIC GASTRIC SLEEVE RESECTION N/A 02/22/2016   Procedure: LAPAROSCOPIC GASTRIC SLEEVE RESECTION WITH UPPER ENDO;  Surgeon: Morene Olives, MD;  Location: [facility] ORS;  Service: General;  Laterality: N/AMERL NAIL RESECTION  12/19/2023   Procedure: NAIL RESECTION;  Surgeon: Laurence Slater PARAS, MD;  Location: [facility] OR;  Service: Gynecology;;   WISDOM TOOTH EXTRACTION     4   Family History: family history includes Alcohol abuse in her father; Arthritis in her father, mother, paternal grandmother, and sister; Cancer in her maternal aunt, maternal grandmother, mother, and paternal grandfather; Depression in her father, maternal aunt, mother, paternal grandmother, and sister; Diabetes in her maternal grandfather, mother, and paternal grandmother; Early death in her maternal grandmother; Hyperlipidemia in her paternal grandfather; Hypertension in her maternal grandfather, mother, and paternal grandmother; Obesity in her maternal grandmother and mother; Stroke in her paternal grandfather. Social History:  reports that she has never smoked. She has never used smokeless tobacco. She reports current alcohol use. She reports that she does not use drugs.   Review of Systems - Patient denies fever, chills, SOB, CP, N/V/D.  History  Blood pressure (!) 110/53, pulse 100, temperature 98.6 F (37 C), temperature source Oral, resp. rate 18, height 5' 7 (1.702 m), weight (!) 158.8 kg, last menstrual period 05/25/2024, SpO2  100%. Exam Physical Exam   Gen: alert, well appearing, no distress Chest: nonlabored breathing CV: no peripheral edema Abdomen: soft, nontender Ext: no evidence of DVT    Assessment/Plan: Admit for acute on chronic anemia secondary to heavy menstrual bleeding in the setting of recent LLE DVT Abnormal Uterine Bleeding: HGB 5.5 in ED.  2 units prepared, to be transfused. Prior endometrial sampling with polypoid disordered proliferative endometrium with simple hyperplasia, no atypia.  LLE DVT Previously on Eliquis , now managed with aspirin . Followed serial US  with Vascular with gradual improvement. Will avoid progestins if able, as this may have been inciting factor. Hopeful for retention of IUD with attempt at placement, previously scheduled with hysteroscopy/D&C on 10/13. Previously offered IVC placement, but declined. Serial venous US  with improvement. Dispo:  Will prioritize correction of anemia given severity, but also in preparation for previously scheduled hysteroscopy, D&C, Mirena  IUD replacement 05/27/24 at 7:30 AM Oral progestins have been somewhat effective but likely contributory to formation of DVT this summer. Will avoid if possible, as well as TXA. Ultimately, Mirena  IUD (if not expelled again) may be the best medical management of abnormal bleeding and hyperplasia. Alternatives may include lower dose oral progestins, Nexplanon, Lupron (pt declined). She is also encouraged to consider definitive management with hysterectomy, for which was referred to [facility] minimally invasive GYN surgery  Evalene DELENA Smiles 05/25/2024, 8:13 PM

## 2024-05-25 NOTE — ED Triage Notes (Signed)
 Hgb 5.5 called from the lab  charge made aware of this pts hgb

## 2024-05-25 NOTE — ED Triage Notes (Signed)
 Pt reports heavy vaginal bleeding since May. Seen this week for sxs but was recommended to come to ED. Pt has had multiple blood transusions in the past. Generalized weakness.

## 2024-05-25 NOTE — ED Triage Notes (Signed)
 Vaginal bleeding intermittent since may when she had a d and c blood thinner was placed for a dvt bleeding increased  then the vaginal bleeding slowed down until  last Friday  when her bleeding increased  this am c/o weaker bleeding still heavy  she pale and she feels weaker  she is scheduled for a d and c this monday

## 2024-05-25 NOTE — ED Notes (Signed)
 Pt ambulated to toilet with assistance, pt reported being fatigued but denies any dizziness.

## 2024-05-25 NOTE — ED Provider Notes (Signed)
 [place] EMERGENCY DEPARTMENT AT [facility] Provider Note   CSN: [id number] Arrival date & time: 05/25/24  1700     Patient presents with: Vaginal Bleeding   Alexandra Newton is a 33 y.o. female.   Patient to ED with heavy vaginal bleeding for the past week. She reports history of same, has been maintained on progesterone until about one week ago when the bleeding increased and has gotten progressively heavier. Today she reports positional lightheadedness without syncope or near syncope. She denies significant pain. History of DVT with brief use of anticoagulation until vaginal bleeding recurred. She had been on aspirin  but stopped this 2 days ago. She reports she is scheduled for a repeat D&C for next Monday.   The history is provided by the patient. No language interpreter was used.  Vaginal Bleeding      Prior to Admission medications   Medication Sig Start Date End Date Taking? Authorizing Provider  acetaminophen  (TYLENOL ) 500 MG tablet Take 1 tablet (500 mg total) by mouth every 6 (six) hours as needed. 12/19/23   Laurence Slater PARAS, MD  ALPRAZolam  (XANAX ) 0.25 MG tablet Take 0.25 mg by mouth 2 (two) times daily as needed for anxiety. 10/02/23   [provider]  aspirin  EC 325 MG tablet Take 1 tablet (325 mg total) by mouth daily. 04/05/24   Laurence Slater PARAS, MD  busPIRone  (BUSPAR ) 7.5 MG tablet Take 1 tablet (7.5 mg total) by mouth 2 (two) times daily. For anxiety Patient taking differently: Take 7.5 mg by mouth See admin instructions. Take 7.5 mg in the morning and 3.75 mg in the evening 09/23/20   Cohoes, Kawanta F, MD  Cholecalciferol (VITAMIN D3) 125 MCG (5000 UT) TABS Take 5,000 Units by mouth at bedtime.    [provider]  cyanocobalamin  1000 MCG tablet Take 1 tablet (1,000 mcg total) by mouth daily. 04/07/24   Tobie Yetta HERO, MD  ferrous sulfate  325 (65 FE) MG EC tablet Take 1 tablet (325 mg total) by mouth every other day. Patient taking differently: Take 325  mg by mouth daily with breakfast. 11/14/23   Davonna Siad, MD  folic acid  (FOLVITE ) 1 MG tablet Take 1 tablet (1 mg total) by mouth daily. 04/05/24   Laurence Slater PARAS, MD  Multiple Vitamins-Minerals (AIRBORNE ELDERBERRY) CHEW Chew 1 capsule by mouth daily as needed (Immune).    [provider]  norethindrone  (AYGESTIN ) 5 MG tablet Take 2 tablets (10 mg total) by mouth daily. Patient taking differently: Take 5 mg by mouth 2 (two) times daily. 04/05/24   Laurence Slater PARAS, MD  Prenatal Vit-Fe Fumarate-FA (PRENATAL MULTIVITAMIN) TABS tablet Take 1 tablet by mouth daily at 12 noon. 04/05/24   Laurence Slater PARAS, MD    Allergies: Chocolate and Porcine (pork) protein-containing drug products    Review of Systems  Genitourinary:  Positive for vaginal bleeding.    Updated Vital Signs BP (!) 115/53   Pulse (!) 114   Temp 98.1 F (36.7 C)   Resp 17   Ht 5' 7 (1.702 m)   Wt (!) 158.8 kg   LMP 05/25/2024   SpO2 100%   BMI 54.83 kg/m   Physical Exam Vitals and nursing note reviewed.  Constitutional:      General: She is not in acute distress.    Appearance: She is ill-appearing. She is not toxic-appearing.  HENT:     Head: Normocephalic.  Eyes:     Comments: Conjunctival pallor.  Cardiovascular:  Rate and Rhythm: Regular rhythm. Tachycardia present.     Heart sounds: Murmur heard.  Pulmonary:     Effort: Pulmonary effort is normal.  Abdominal:     Palpations: Abdomen is soft.     Tenderness: There is no abdominal tenderness.  Musculoskeletal:        General: Normal range of motion.     Cervical back: Normal range of motion and neck supple.  Skin:    Coloration: Skin is pale.  Neurological:     Mental Status: She is alert and oriented to person, place, and time.     (all labs ordered are listed, but only abnormal results are displayed) Labs Reviewed  CBC WITH DIFFERENTIAL/PLATELET - Abnormal; Notable for the following components:      Result Value   WBC 16.3 (*)    RBC 1.90  (*)    Hemoglobin 5.5 (*)    HCT 18.9 (*)    MCHC 29.1 (*)    RDW 18.6 (*)    nRBC 1.2 (*)    Neutro Abs 11.3 (*)    Abs Immature Granulocytes 0.26 (*)    All other components within normal limits  URINALYSIS, ROUTINE W REFLEX MICROSCOPIC - Abnormal; Notable for the following components:   Color, Urine RED (*)    APPearance CLOUDY (*)    Hgb urine dipstick LARGE (*)    Ketones, ur 5 (*)    Protein, ur 100 (*)    All other components within normal limits  COMPREHENSIVE METABOLIC PANEL WITH GFR - Abnormal; Notable for the following components:   Potassium 3.4 (*)    CO2 19 (*)    Glucose, Bld 133 (*)    Calcium 8.2 (*)    Total Protein 5.7 (*)    Albumin 2.8 (*)    All other components within normal limits  HCG, SERUM, QUALITATIVE  APTT  FIBRINOGEN  TYPE AND SCREEN  PREPARE RBC (CROSSMATCH)    EKG: None  Radiology: No results found.   .Critical Care  Performed by: Odell Balls, PA-C Authorized by: Odell Balls, PA-C   Critical care provider statement:    Critical care time (minutes):  30   Critical care was necessary to treat or prevent imminent or life-threatening deterioration of the following conditions:  Circulatory failure   Critical care was time spent personally by me on the following activities:  Development of treatment plan with patient or surrogate, discussions with consultants, examination of patient, interpretation of cardiac output measurements, ordering and review of laboratory studies, pulse oximetry, re-evaluation of patient's condition and review of old charts    Medications Ordered in the ED  sodium chloride  0.9 % bolus 1,000 mL (has no administration in time range)  0.9 %  sodium chloride  infusion (Manually program via Guardrails IV Fluids) (has no administration in time range)  prenatal multivitamin tablet 1 tablet (has no administration in time range)    Clinical Course as of 05/25/24 1943  Sat May 25, 2024  1934 Patient to ED with  recurrent vaginal bleeding described as heavy flow x 1 week, heaviest today requiring the use of towels over pads/tampons. Sees Dr. Laurence ([facility]) and scheduled for [facility] in 2 days. She is tachycardic, last blood pressure 106 systolic. Blood transfusion discussed with the patient. Risks and benefits discussed. Patient has received multiple transfusions in the past and is felt familiar with the all risk and benefit. Transfusion begun in ED.   Discussed with Dr. Lequita ([facility] on-call)  who accepts for admission and further care.  [SU]    Clinical Course User Index [SU] Odell Balls, PA-C                                 Medical Decision Making Amount and/or Complexity of Data Reviewed Labs: ordered.  Risk Prescription drug management. Decision regarding hospitalization.        Final diagnoses:  Dysfunctional uterine bleeding  Symptomatic anemia    ED Discharge Orders     None          Odell Balls RIGGERS 05/25/24 2043    Laurice Maude BROCKS, MD 05/25/24 2322

## 2024-05-25 NOTE — ED Provider Triage Note (Signed)
 Emergency Medicine Provider Triage Evaluation Note  Alexandra Newton , a 33 y.o. female  was evaluated in triage.  Pt complains of vaginal bleeding. Reports been having issues with bleeding since May, she has had a D&C previously as well as blood transfusions.  Has been referred to [facility] for hysterectomy, however has not found a surgeon that will perform the procedure for her.  Over the last week she has had severe bleeding to the point she has been sitting on puppy pee pads on top of her tampon and pads.  Reports anytime she gets up and walks she is dripping blood on the floor.  Review of Systems  Positive:  Negative:   Physical Exam  BP 138/68 (BP Location: Left Arm)   Pulse (!) 114   Temp 98.1 F (36.7 C)   Resp 17   Ht 5' 7 (1.702 m)   Wt (!) 158.8 kg   LMP 05/25/2024   SpO2 100%   BMI 54.83 kg/m  Gen:   Awake, no distress   Resp:  Normal effort  MSK:   Moves extremities without difficulty  Other:  Very pale appearing  Medical Decision Making  Medically screening exam initiated at 5:46 PM.  Appropriate orders placed.  Rea SAILOR Rutigliano was informed that the remainder of the evaluation will be completed by another provider, this initial triage assessment does not replace that evaluation, and the importance of remaining in the ED until their evaluation is complete.     Nora Lauraine LABOR, PA-C 05/25/24 [phone]

## 2024-05-26 DIAGNOSIS — Z8249 Family history of ischemic heart disease and other diseases of the circulatory system: Secondary | ICD-10-CM | POA: Diagnosis not present

## 2024-05-26 DIAGNOSIS — D62 Acute posthemorrhagic anemia: Secondary | ICD-10-CM | POA: Diagnosis present

## 2024-05-26 DIAGNOSIS — Z6841 Body Mass Index (BMI) 40.0 and over, adult: Secondary | ICD-10-CM | POA: Diagnosis not present

## 2024-05-26 DIAGNOSIS — E66813 Obesity, class 3: Secondary | ICD-10-CM | POA: Diagnosis present

## 2024-05-26 DIAGNOSIS — F418 Other specified anxiety disorders: Secondary | ICD-10-CM | POA: Diagnosis not present

## 2024-05-26 DIAGNOSIS — N920 Excessive and frequent menstruation with regular cycle: Secondary | ICD-10-CM | POA: Diagnosis present

## 2024-05-26 DIAGNOSIS — Z30430 Encounter for insertion of intrauterine contraceptive device: Secondary | ICD-10-CM | POA: Diagnosis not present

## 2024-05-26 DIAGNOSIS — Z7982 Long term (current) use of aspirin: Secondary | ICD-10-CM | POA: Diagnosis not present

## 2024-05-26 DIAGNOSIS — Z86718 Personal history of other venous thrombosis and embolism: Secondary | ICD-10-CM | POA: Diagnosis not present

## 2024-05-26 DIAGNOSIS — Z833 Family history of diabetes mellitus: Secondary | ICD-10-CM | POA: Diagnosis not present

## 2024-05-26 DIAGNOSIS — N8500 Endometrial hyperplasia, unspecified: Secondary | ICD-10-CM | POA: Diagnosis not present

## 2024-05-26 DIAGNOSIS — Z79899 Other long term (current) drug therapy: Secondary | ICD-10-CM | POA: Diagnosis not present

## 2024-05-26 DIAGNOSIS — N8501 Benign endometrial hyperplasia: Secondary | ICD-10-CM | POA: Diagnosis present

## 2024-05-26 LAB — CBC
HCT: 20.1 % — ABNORMAL LOW (ref 36.0–46.0)
HCT: 23.8 % — ABNORMAL LOW (ref 36.0–46.0)
Hemoglobin: 6.3 g/dL — CL (ref 12.0–15.0)
Hemoglobin: 7.8 g/dL — ABNORMAL LOW (ref 12.0–15.0)
MCH: 28.4 pg (ref 26.0–34.0)
MCH: 29.1 pg (ref 26.0–34.0)
MCHC: 31.3 g/dL (ref 30.0–36.0)
MCHC: 32.8 g/dL (ref 30.0–36.0)
MCV: 90.5 fL (ref 80.0–100.0)
MCV: 88.8 fL (ref 80.0–100.0)
Platelets: 259 K/uL (ref 150–400)
Platelets: 271 K/uL (ref 150–400)
RBC: 2.22 MIL/uL — ABNORMAL LOW (ref 3.87–5.11)
RBC: 2.68 MIL/uL — ABNORMAL LOW (ref 3.87–5.11)
RDW: 18.8 % — ABNORMAL HIGH (ref 11.5–15.5)
RDW: 18.6 % — ABNORMAL HIGH (ref 11.5–15.5)
WBC: 9.8 K/uL (ref 4.0–10.5)
WBC: 9.9 K/uL (ref 4.0–10.5)
nRBC: 0.9 % — ABNORMAL HIGH (ref 0.0–0.2)
nRBC: 1.2 % — ABNORMAL HIGH (ref 0.0–0.2)

## 2024-05-26 LAB — PREPARE RBC (CROSSMATCH)

## 2024-05-26 LAB — RPR: RPR Ser Ql: NONREACTIVE

## 2024-05-26 MED ORDER — SODIUM CHLORIDE 0.9% IV SOLUTION: Freq: Once | INTRAVENOUS | Status: DC

## 2024-05-26 MED ORDER — SODIUM CHLORIDE 0.9% IV SOLUTION
Freq: Once | INTRAVENOUS | Status: AC
  Administered 2024-05-26: 10 mL/h via INTRAVENOUS

## 2024-05-26 MED ORDER — NORETHINDRONE ACETATE 5 MG PO TABS
5.0000 mg | ORAL_TABLET | Freq: Two times a day (BID) | ORAL | Status: DC
  Administered 2024-05-26 – 2024-05-27 (×3): 5 mg via ORAL
  Filled 2024-05-26 (×4): qty 1

## 2024-05-26 MED ORDER — IBUPROFEN 600 MG PO TABS: 600.0000 mg | ORAL_TABLET | Freq: Three times a day (TID) | ORAL | Status: DC | PRN

## 2024-05-26 MED ORDER — ACETAMINOPHEN 325 MG PO TABS
650.0000 mg | ORAL_TABLET | Freq: Three times a day (TID) | ORAL | Status: DC | PRN
  Administered 2024-05-26: 650 mg via ORAL
  Filled 2024-05-26: qty 2

## 2024-05-26 NOTE — Progress Notes (Signed)
 At bedside to check on patient. Bleeding has been manageable throughout the day. She continues to feel well overall.  Today, HGB 6.3 > 2 u pRBC > 7.8.  Discussed with Dr. Laurence (primary GYN), with whom hysteroscopy is scheduled tomorrow. Will plan to prepare 2 additional units for any perioperative bleeding, recheck CBC in AM  Discussed plan with patient.   Massie Smiles

## 2024-05-26 NOTE — Progress Notes (Signed)
 Gynecology Progress Note  Alexandra Newton is a 33 y.o. female G0 with a history of abnormal uterine bleeding, LLE DVT, and anemia presents to [facility] ED with acute on chronic vaginal bleeding.   Subjective:  Patient reports no overnight events. Bleeding has persisted, but is overall improved since exacerbation over the weekend. She changed a few pads overnight for hygiene, these were not saturated.   Overall, she feels improved. She is no longer fatigued with ambulating. She denies lightheadedness, dizziness, CP, SOB, palpitations.   Objective: Blood pressure (!) 112/53, pulse 89, temperature 98.2 F (36.8 C), temperature source Oral, resp. rate 17, height 5' 7 (1.702 m), weight (!) 158.8 kg, last menstrual period 05/25/2024, SpO2 100%.  Physical Exam:  Gen: alert, well appearing, no distress Chest: nonlabored breathing CV: no peripheral edema Abdomen: soft, nondistended Ext: No evidence of DVT  Recent Labs    05/25/24 2331 05/26/24 0708  HGB 5.7* 6.3*  HCT 18.8* 20.1*    Assessment/Plan: Admitted for acute on chronic anemia secondary to heavy menstrual bleeding in the setting of recent LLE DVT Abnormal Uterine Bleeding: HGB 5.5 in ED > 2 units pRBC > HGB 6.3 this AM UO adequate, overall significant improvement. Additional 2u pRBC ordered, recheck this afternoon. Prior endometrial sampling with polypoid disordered proliferative endometrium with simple hyperplasia, no atypia.  LLE DVT Previously on Eliquis , now managed with aspirin . Followed serial US  with Vascular with gradual improvement. Will restart low dose aygestin  for continued (though improved) bleeding. Hopeful for retention of IUD with attempt at placement, previously scheduled with hysteroscopy/D&C on 10/13. Previously offered IVC placement, but declined. Serial venous US  with improvement as outpatient. Dispo:  Will prioritize correction of anemia given severity, but also in preparation for previously scheduled hysteroscopy,  D&C, Mirena  IUD replacement 05/27/24 at 7:30 AM. Additional 2 u pRBC ordered this AM. Continue to monitor bleeding. Low dose aygestin  restarted this morning.  Ultimately, Mirena  IUD (if not expelled again) may be the best medical management of abnormal bleeding and hyperplasia. Alternatives may include low dose oral progestins, Nexplanon, Lupron (pt declined). She is also encouraged to consider definitive management with hysterectomy, for which was referred to [facility] minimally invasive GYN surgery NPO at midnight tonight for planned procedure in AM.    LOS: 0 days   Evalene DELENA Smiles 05/26/2024, 8:42 AM

## 2024-05-27 ENCOUNTER — Inpatient Hospital Stay (HOSPITAL_COMMUNITY): Payer: Self-pay | Admitting: Anesthesiology

## 2024-05-27 ENCOUNTER — Other Ambulatory Visit: Payer: Self-pay

## 2024-05-27 ENCOUNTER — Encounter (HOSPITAL_COMMUNITY): Payer: Self-pay | Admitting: Obstetrics and Gynecology

## 2024-05-27 ENCOUNTER — Encounter (HOSPITAL_COMMUNITY)
Admission: EM | Disposition: A | Discharge status: Home / Self Care | Payer: Self-pay | Source: Home / Self Care | Attending: Obstetrics and Gynecology

## 2024-05-27 ENCOUNTER — Ambulatory Visit (HOSPITAL_COMMUNITY): Admission: RE | Admit: 2024-05-27 | Source: Home / Self Care | Admitting: Obstetrics and Gynecology

## 2024-05-27 DIAGNOSIS — N8500 Endometrial hyperplasia, unspecified: Secondary | ICD-10-CM

## 2024-05-27 DIAGNOSIS — F418 Other specified anxiety disorders: Secondary | ICD-10-CM

## 2024-05-27 DIAGNOSIS — Z6841 Body Mass Index (BMI) 40.0 and over, adult: Secondary | ICD-10-CM

## 2024-05-27 DIAGNOSIS — N939 Abnormal uterine and vaginal bleeding, unspecified: Secondary | ICD-10-CM

## 2024-05-27 HISTORY — PX: INTRAUTERINE DEVICE (IUD) INSERTION: SHX5877

## 2024-05-27 HISTORY — PX: DILATATION & CURRETTAGE/HYSTEROSCOPY WITH RESECTOCOPE: SHX5572

## 2024-05-27 LAB — CBC
HCT: 26.4 % — ABNORMAL LOW (ref 36.0–46.0)
Hemoglobin: 8.5 g/dL — ABNORMAL LOW (ref 12.0–15.0)
MCH: 29 pg (ref 26.0–34.0)
MCHC: 32.2 g/dL (ref 30.0–36.0)
MCV: 90.1 fL (ref 80.0–100.0)
Platelets: 277 K/uL (ref 150–400)
RBC: 2.93 MIL/uL — ABNORMAL LOW (ref 3.87–5.11)
RDW: 18.6 % — ABNORMAL HIGH (ref 11.5–15.5)
WBC: 9.9 K/uL (ref 4.0–10.5)
nRBC: 0.6 % — ABNORMAL HIGH (ref 0.0–0.2)

## 2024-05-27 SURGERY — DILATATION & CURETTAGE/HYSTEROSCOPY WITH RESECTOCOPE
Anesthesia: General | Site: Vagina

## 2024-05-27 MED ORDER — SUGAMMADEX SODIUM 200 MG/2ML IV SOLN
INTRAVENOUS | Status: DC | PRN
  Administered 2024-05-27: 200 mg via INTRAVENOUS

## 2024-05-27 MED ORDER — ONDANSETRON HCL 4 MG/2ML IJ SOLN
INTRAMUSCULAR | Status: DC | PRN
  Administered 2024-05-27: 4 mg via INTRAVENOUS

## 2024-05-27 MED ORDER — PROPOFOL 10 MG/ML IV BOLUS
INTRAVENOUS | Status: AC
  Filled 2024-05-27: qty 20

## 2024-05-27 MED ORDER — VASOPRESSIN 20 UNIT/ML IV SOLN
INTRAVENOUS | Status: AC
  Filled 2024-05-27: qty 1

## 2024-05-27 MED ORDER — FENTANYL CITRATE (PF) 250 MCG/5ML IJ SOLN
INTRAMUSCULAR | Status: DC | PRN
  Administered 2024-05-27 (×3): 50 ug via INTRAVENOUS

## 2024-05-27 MED ORDER — LEVONORGESTREL 20 MCG/DAY IU IUD
1.0000 | INTRAUTERINE_SYSTEM | INTRAUTERINE | Status: AC
  Administered 2024-05-27: 1 via INTRAUTERINE

## 2024-05-27 MED ORDER — MIDAZOLAM HCL 2 MG/2ML IJ SOLN
INTRAMUSCULAR | Status: DC | PRN
  Administered 2024-05-27: 2 mg via INTRAVENOUS

## 2024-05-27 MED ORDER — LACTATED RINGERS IV SOLN: INTRAVENOUS | Status: DC

## 2024-05-27 MED ORDER — DEXAMETHASONE SOD PHOSPHATE PF 10 MG/ML IJ SOLN
INTRAMUSCULAR | Status: DC | PRN
  Administered 2024-05-27: 10 mg via INTRAVENOUS

## 2024-05-27 MED ORDER — HYDROMORPHONE HCL 1 MG/ML IJ SOLN: 0.2500 mg | INTRAMUSCULAR | Status: DC | PRN

## 2024-05-27 MED ORDER — MIDAZOLAM HCL 2 MG/2ML IJ SOLN
INTRAMUSCULAR | Status: AC
  Filled 2024-05-27: qty 2

## 2024-05-27 MED ORDER — SODIUM CHLORIDE 0.9 % IR SOLN
Status: DC | PRN
  Administered 2024-05-27: 3000 mL

## 2024-05-27 MED ORDER — LEVONORGESTREL 20 MCG/DAY IU IUD
INTRAUTERINE_SYSTEM | INTRAUTERINE | Status: AC
  Filled 2024-05-27: qty 1

## 2024-05-27 MED ORDER — PROPOFOL 10 MG/ML IV BOLUS
INTRAVENOUS | Status: DC | PRN
  Administered 2024-05-27: 200 mg via INTRAVENOUS

## 2024-05-27 MED ORDER — CHLORHEXIDINE GLUCONATE 0.12 % MT SOLN
15.0000 mL | Freq: Once | OROMUCOSAL | Status: AC
  Administered 2024-05-27: 15 mL via OROMUCOSAL

## 2024-05-27 MED ORDER — FENTANYL CITRATE (PF) 250 MCG/5ML IJ SOLN
INTRAMUSCULAR | Status: AC
  Filled 2024-05-27: qty 5

## 2024-05-27 MED ORDER — ORAL CARE MOUTH RINSE: 15.0000 mL | Freq: Once | OROMUCOSAL | Status: AC

## 2024-05-27 MED ORDER — LIDOCAINE HCL 1 % IJ SOLN
INTRAMUSCULAR | Status: DC | PRN
  Administered 2024-05-27: 10 mL

## 2024-05-27 MED ORDER — ROCURONIUM BROMIDE 10 MG/ML (PF) SYRINGE
PREFILLED_SYRINGE | INTRAVENOUS | Status: DC | PRN
  Administered 2024-05-27: 50 mg via INTRAVENOUS

## 2024-05-27 MED ORDER — SODIUM CHLORIDE (PF) 0.9 % IJ SOLN
INTRAMUSCULAR | Status: AC
  Filled 2024-05-27: qty 100

## 2024-05-27 MED ORDER — BUPIVACAINE HCL (PF) 0.25 % IJ SOLN
INTRAMUSCULAR | Status: AC
  Filled 2024-05-27: qty 30

## 2024-05-27 MED ORDER — PROPOFOL 1000 MG/100ML IV EMUL
INTRAVENOUS | Status: AC
  Filled 2024-05-27: qty 100

## 2024-05-27 MED ORDER — LIDOCAINE 2% (20 MG/ML) 5 ML SYRINGE
INTRAMUSCULAR | Status: DC | PRN
  Administered 2024-05-27: 60 mg via INTRAVENOUS

## 2024-05-27 MED ORDER — POVIDONE-IODINE 10 % EX SWAB: 2.0000 | Freq: Once | CUTANEOUS | Status: DC

## 2024-05-27 MED ORDER — LIDOCAINE HCL 1 % IJ SOLN
INTRAMUSCULAR | Status: AC
  Filled 2024-05-27: qty 20

## 2024-05-27 SURGICAL SUPPLY — 22 items
CATH ROBINSON RED A/P 16FR (CATHETERS) IMPLANT
COVER MAYO STAND STRL (DRAPES) ×3 IMPLANT
DEVICE MYOSURE LITE (MISCELLANEOUS) IMPLANT
DEVICE MYOSURE REACH (MISCELLANEOUS) IMPLANT
DILATOR CANAL MILEX (MISCELLANEOUS) IMPLANT
GAUZE 4X4 16PLY ~~LOC~~+RFID DBL (SPONGE) IMPLANT
GLOVE BIOGEL PI IND STRL 6 (GLOVE) ×3 IMPLANT
GLOVE SS PI 5.5 STRL (GLOVE) ×3 IMPLANT
GOWN STRL REUS W/ TWL LRG LVL3 (GOWN DISPOSABLE) ×3 IMPLANT
KIT PROCED FLUENT PRO FLT212S (KITS) ×3 IMPLANT
KIT TURNOVER KIT B (KITS) ×3 IMPLANT
MIRENA IUD IMPLANT
NS IRRIG 1000ML POUR BTL (IV SOLUTION) ×3 IMPLANT
PACK VAGINAL MINOR WOMEN LF (CUSTOM PROCEDURE TRAY) ×3 IMPLANT
PAD OB MATERNITY 11 LF (PERSONAL CARE ITEMS) ×3 IMPLANT
PAD PREP 24X48 CUFFED NSTRL (MISCELLANEOUS) ×3 IMPLANT
SEAL CERVICAL OMNI LOK (ABLATOR) IMPLANT
SEAL ROD LENS SCOPE MYOSURE (ABLATOR) ×3 IMPLANT
SLEEVE SCD COMPRESS KNEE MED (STOCKING) ×3 IMPLANT
SOL .9 NS 3000ML IRR UROMATIC (IV SOLUTION) ×3 IMPLANT
SYSTEM TISS REMOVAL MYOSURE XL (MISCELLANEOUS) IMPLANT
TOWEL GREEN STERILE (TOWEL DISPOSABLE) ×3 IMPLANT

## 2024-05-27 NOTE — Transfer of Care (Signed)
 Immediate Anesthesia Transfer of Care Note  Patient: Alexandra Newton  Procedure(s) Performed: DILATATION & CURETTAGE/HYSTEROSCOPY WITH RESECTOCOPE (Vagina ) INSERTION, INTRAUTERINE DEVICE (Uterus)  Patient Location: PACU  Anesthesia Type:General  Level of Consciousness: awake, alert , oriented, and patient cooperative  Airway & Oxygen Therapy: Patient Spontanous Breathing and Patient connected to face mask oxygen  Post-op Assessment: Report given to RN and Post -op Vital signs reviewed and stable  Post vital signs: Reviewed and stable  Last Vitals:  Vitals Value Taken Time  BP 129/87 05/27/24 08:15  Temp    Pulse 77 05/27/24 08:19  Resp 22 05/27/24 08:19  SpO2 95 % 05/27/24 08:19  Vitals shown include unfiled device data.  Last Pain:  Vitals:   05/27/24 0705  TempSrc:   PainSc: 0-No pain         Complications: No notable events documented.

## 2024-05-27 NOTE — Anesthesia Preprocedure Evaluation (Addendum)
 Anesthesia Evaluation  Patient identified by MRN, date of birth, ID band Patient awake    Reviewed: Allergy & Precautions, H&P , NPO status , Patient's Chart, lab work & pertinent test results  Airway Mallampati: I  TM Distance: >3 FB Neck ROM: Full    Dental no notable dental hx. (+) Teeth Intact, Dental Advisory Given   Pulmonary neg pulmonary ROS   Pulmonary exam normal breath sounds clear to auscultation       Cardiovascular negative cardio ROS  Rhythm:Regular Rate:Normal     Neuro/Psych   Anxiety Depression    negative neurological ROS     GI/Hepatic negative GI ROS, Neg liver ROS,,,  Endo/Other    Class 4 obesity  Renal/GU negative Renal ROS  negative genitourinary   Musculoskeletal   Abdominal   Peds  Hematology  (+) Blood dyscrasia, anemia   Anesthesia Other Findings   Reproductive/Obstetrics negative OB ROS                              Anesthesia Physical Anesthesia Plan  ASA: 3  Anesthesia Plan: General   Post-op Pain Management: Tylenol  PO (pre-op)*   Induction: Intravenous  PONV Risk Score and Plan: 4 or greater and Ondansetron , Dexamethasone  and Midazolam   Airway Management Planned: Oral ETT  Additional Equipment:   Intra-op Plan:   Post-operative Plan: Extubation in OR  Informed Consent: I have reviewed the patients History and Physical, chart, labs and discussed the procedure including the risks, benefits and alternatives for the proposed anesthesia with the patient or authorized representative who has indicated his/her understanding and acceptance.     Dental advisory given  Plan Discussed with: CRNA  Anesthesia Plan Comments:          Anesthesia Quick Evaluation

## 2024-05-27 NOTE — Op Note (Addendum)
 PREOPERATIVE DIAGNOSES: 1. AUB 2. Simple endometrial hyperplasia, no atypia 3. Lower extremity DVT  POSTOPERATIVE DIAGNOSES: Same  PROCEDURE PERFORMED: Hysteroscopy, dilation and curettage, Mirena  IUD insertion  SURGEON: Dr. Slater Door  ANESTHESIA: General  ESTIMATED BLOOD LOSS: 25ccs  COMPLICATIONS: None  TUBES: None.  DRAINS: None  PATHOLOGY: Endometrial curettings  FINDINGS: On exam, under anesthesia, normal appearing vulva and vagina. Hysteroscopic eval of endometrium with fluffy-appearing endometrium, no polyps or lesions noted.  Procedure: The patient was taken to the operating room where she was properly prepped and draped in sterile manner under general anesthesia. After bimanual examination, the cervix was exposed with a speculum and the anterior lip of the cervix grasped with a tenaculum. Paracervical block performed. The endocervical canal was then progressively dilated. The hysteroscope was introduced into the uterus and an assessment of the cavity was performed. Bilateral tubal ostia were visualized. A sharp curette was introduced and a curettage was then performed. Mirena  IUD was placed in standard fashion and strings were trimmed to 3cm. All instruments were removed from vagina. The sponge and lap counts were correct times 2 at this time. The patient's procedure was terminated and she was awakened. She was sent to the recovery room in good condition.   Slater Door, MD

## 2024-05-27 NOTE — Progress Notes (Signed)
 Report given to Pre-op nurse; transport is here to take patient to the main OR so that patient can have her procedure completed today. Patient is in stable condition.

## 2024-05-27 NOTE — Plan of Care (Signed)
  Problem: Nutrition: Goal: Adequate nutrition will be maintained Outcome: Progressing   Problem: Coping: Goal: Level of anxiety will decrease Outcome: Progressing   Problem: Pain Managment: Goal: General experience of comfort will improve and/or be controlled Outcome: Progressing

## 2024-05-27 NOTE — Anesthesia Procedure Notes (Signed)
 Procedure Name: Intubation Date/Time: 05/27/2024 7:41 AM  Performed by: Nada Corean CROME, CRNAPre-anesthesia Checklist: Patient identified, Emergency Drugs available, Suction available, Patient being monitored and Timeout performed Patient Re-evaluated:Patient Re-evaluated prior to induction Oxygen Delivery Method: Circle system utilized Preoxygenation: Pre-oxygenation with 100% oxygen Induction Type: IV induction Ventilation: Mask ventilation without difficulty Laryngoscope Size: Mac and 3 Grade View: Grade I Tube type: Oral Tube size: 7.0 mm Number of attempts: 1 Airway Equipment and Method: Stylet Placement Confirmation: ETT inserted through vocal cords under direct vision, positive ETCO2 and breath sounds checked- equal and bilateral Secured at: 22 cm Tube secured with: Tape Dental Injury: Teeth and Oropharynx as per pre-operative assessment

## 2024-05-27 NOTE — Progress Notes (Signed)
 H&P update   No updates to prior H&P. Preop hgb now 8.5 s/p 4u pRBCs. She is T&C x 2 additional units. She was consented in preop. Risks again discussed, all questions answered, and consent signed. Proceed with above surgery. Plan obs after surgery - if bleeding stable, for discharge this pm.  Slater Door, MD

## 2024-05-27 NOTE — Discharge Summary (Signed)
 Physician Discharge Summary  Patient ID: Alexandra Newton Paiva MRN: [id number] DOB/AGE: 33/01/1991 33 y.o.  Admit date: 05/25/2024 Discharge date: 05/27/2024  Admission Diagnoses: Abnormal uterine bleeding Simple endometrial hyperplasia Acute blood loss anemia, clinically significant LLE DVT  Discharge Diagnoses:  Principal Problem:   Vaginal bleeding Same as above  Discharged Condition: good  Hospital Course:  Analysia was admitted from the ER for management of acute symptomatic blood loss anemia - 4u pRBCs transfused and she was restarted on aygestin . She was then taken to the OR for planned scheduled hysteroscopy, D&C, IUD insertion. Post procedure, she was observed and bleeding was minimal. She was discharged home in stable condition. Postoperative f/u in the office to be scheduled. She has been referred to [facility] for definitive management with minimally invasive hysterectomy given medical comorbidities - has appointment scheduled. Additionally she follows with vascular outpatient as well as hematology.  Consults: None  Disposition: Discharge disposition: 01-Home or Self Care       Discharge Instructions     Call MD for:   Complete by: As directed    Heavy vaginal bleeding or abnormal vaginal discharge   Call MD for:  difficulty breathing, headache or visual disturbances   Complete by: As directed    Call MD for:  persistant nausea and vomiting   Complete by: As directed    Call MD for:  redness, tenderness, or signs of infection (pain, swelling, redness, odor or green/yellow discharge around incision site)   Complete by: As directed    Call MD for:  severe uncontrolled pain   Complete by: As directed    Call MD for:  temperature >100.4   Complete by: As directed    Diet general   Complete by: As directed    Driving Restrictions   Complete by: As directed    Do not drive until you are off narcotic pain medications and you feel like you can react in an emergency.   Increase  activity slowly   Complete by: As directed    Lifting restrictions   Complete by: As directed    Don't lift anything more than 15-20 pounds   Sexual Activity Restrictions   Complete by: As directed    Nothing in the vagina x 2 to 6 weeks. We will discuss at clinic visit.      Allergies as of 05/27/2024       Reactions   Chocolate Other (See Comments)   Inflammation (allergy testing completed)   Porcine (pork) Protein-containing Drug Products    Inflammation         Medication List     STOP taking these medications    norethindrone  5 MG tablet Commonly known as: AYGESTIN        TAKE these medications    acetaminophen  500 MG tablet Commonly known as: TYLENOL  Take 1 tablet (500 mg total) by mouth every 6 (six) hours as needed.   Airborne Elderberry Chew Chew 1 capsule by mouth daily as needed (Immune).   ALPRAZolam  0.25 MG tablet Commonly known as: XANAX  Take 0.25 mg by mouth 2 (two) times daily as needed for anxiety.   aspirin  EC 325 MG tablet Take 1 tablet (325 mg total) by mouth daily.   busPIRone  7.5 MG tablet Commonly known as: BUSPAR  Take 1 tablet (7.5 mg total) by mouth 2 (two) times daily. For anxiety What changed:  when to take this additional instructions   cyanocobalamin  1000 MCG tablet Take 1 tablet (1,000 mcg total) by mouth daily.  ferrous sulfate  325 (65 FE) MG EC tablet Take 1 tablet (325 mg total) by mouth every other day. What changed: when to take this   folic acid  1 MG tablet Commonly known as: FOLVITE  Take 1 tablet (1 mg total) by mouth daily.   prenatal multivitamin Tabs tablet Take 1 tablet by mouth daily at 12 noon.   Vitamin D3 125 MCG (5000 UT) Tabs Take 5,000 Units by mouth at bedtime.         Signed: Slater JINNY Door 05/27/2024, 1:04 PM

## 2024-05-27 NOTE — Anesthesia Postprocedure Evaluation (Signed)
 Anesthesia Post Note  Patient: Alexandra Newton  Procedure(s) Performed: DILATATION & CURETTAGE/HYSTEROSCOPY WITH RESECTOCOPE (Vagina ) INSERTION, INTRAUTERINE DEVICE (Uterus)     Patient location during evaluation: PACU Anesthesia Type: General Level of consciousness: awake and alert Pain management: pain level controlled Vital Signs Assessment: post-procedure vital signs reviewed and stable Respiratory status: spontaneous breathing, nonlabored ventilation and respiratory function stable Cardiovascular status: blood pressure returned to baseline and stable Postop Assessment: no apparent nausea or vomiting Anesthetic complications: no   No notable events documented.  Last Vitals:  Vitals:   05/27/24 0830 05/27/24 0845  BP: 132/79 128/75  Pulse: 76 73  Resp: (!) 27 (!) 25  Temp:  37.1 C  SpO2: 93% 95%    Last Pain:  Vitals:   05/27/24 0900  TempSrc:   PainSc: 0-No pain                 Tiffeny Minchew,W. EDMOND

## 2024-05-28 ENCOUNTER — Encounter

## 2024-05-28 ENCOUNTER — Encounter (HOSPITAL_COMMUNITY): Payer: Self-pay | Admitting: Obstetrics and Gynecology

## 2024-05-29 LAB — BPAM RBC
Blood Product Expiration Date: 202511052359
Blood Product Expiration Date: 202511052359
Blood Product Expiration Date: 202511052359
Blood Product Expiration Date: 202511052359
Blood Product Expiration Date: 202511072359
Blood Product Expiration Date: 202511072359
ISSUE DATE / TIME: 202510112001
ISSUE DATE / TIME: 202510120018
ISSUE DATE / TIME: 202510120941
ISSUE DATE / TIME: 202510121227
Unit Type and Rh: 202511052359
Unit Type and Rh: 6200
Unit Type and Rh: 6200
Unit Type and Rh: 6200
Unit Type and Rh: 6200
Unit Type and Rh: 6200
Unit Type and Rh: 6200

## 2024-05-29 LAB — TYPE AND SCREEN
ABO/RH(D): A POS
Antibody Screen: NEGATIVE
Unit division: 0
Unit division: 0
Unit division: 0
Unit division: 0
Unit division: 0
Unit division: 0

## 2024-05-31 LAB — SURGICAL PATHOLOGY

## 2024-06-04 ENCOUNTER — Encounter

## 2024-06-11 ENCOUNTER — Encounter

## 2024-06-18 ENCOUNTER — Encounter

## 2024-06-25 ENCOUNTER — Encounter

## 2024-07-08 ENCOUNTER — Encounter: Payer: Self-pay | Admitting: Internal Medicine

## 2024-07-24 ENCOUNTER — Inpatient Hospital Stay: Attending: Oncology

## 2024-07-31 ENCOUNTER — Telehealth: Admitting: Oncology

## 2024-08-12 ENCOUNTER — Encounter: Payer: Self-pay | Admitting: *Deleted
